# Patient Record
Sex: Male | Born: 1960 | Race: White | Hispanic: No | Marital: Married | Smoking: Never smoker
Health system: Southern US, Community
[De-identification: ages and names within clinical notes are randomized; demographics above are authoritative.]

## PROBLEM LIST (undated history)

## (undated) DIAGNOSIS — K219 Gastro-esophageal reflux disease without esophagitis: Secondary | ICD-10-CM

## (undated) DIAGNOSIS — T7840XA Allergy, unspecified, initial encounter: Secondary | ICD-10-CM

## (undated) DIAGNOSIS — R112 Nausea with vomiting, unspecified: Secondary | ICD-10-CM

## (undated) DIAGNOSIS — I739 Peripheral vascular disease, unspecified: Secondary | ICD-10-CM

## (undated) DIAGNOSIS — T4145XA Adverse effect of unspecified anesthetic, initial encounter: Secondary | ICD-10-CM

## (undated) DIAGNOSIS — M1990 Unspecified osteoarthritis, unspecified site: Secondary | ICD-10-CM

## (undated) DIAGNOSIS — K635 Polyp of colon: Secondary | ICD-10-CM

## (undated) DIAGNOSIS — K2270 Barrett's esophagus without dysplasia: Secondary | ICD-10-CM

## (undated) DIAGNOSIS — Z86718 Personal history of other venous thrombosis and embolism: Secondary | ICD-10-CM

## (undated) DIAGNOSIS — N189 Chronic kidney disease, unspecified: Secondary | ICD-10-CM

## (undated) DIAGNOSIS — Z9889 Other specified postprocedural states: Secondary | ICD-10-CM

## (undated) DIAGNOSIS — R945 Abnormal results of liver function studies: Secondary | ICD-10-CM

## (undated) DIAGNOSIS — T8859XA Other complications of anesthesia, initial encounter: Secondary | ICD-10-CM

## (undated) DIAGNOSIS — R7989 Other specified abnormal findings of blood chemistry: Secondary | ICD-10-CM

## (undated) HISTORY — PX: LASIK: SHX215

## (undated) HISTORY — DX: Abnormal results of liver function studies: R94.5

## (undated) HISTORY — PX: SHOULDER ARTHROSCOPY W/ ACROMIAL REPAIR: SUR94

## (undated) HISTORY — DX: Barrett's esophagus without dysplasia: K22.70

## (undated) HISTORY — DX: Allergy, unspecified, initial encounter: T78.40XA

## (undated) HISTORY — DX: Polyp of colon: K63.5

## (undated) HISTORY — PX: ORIF NASAL FRACTURE: SHX5359

## (undated) HISTORY — DX: Other specified abnormal findings of blood chemistry: R79.89

---

## 2008-07-05 ENCOUNTER — Ambulatory Visit: Payer: Self-pay

## 2008-08-06 ENCOUNTER — Encounter: Admission: RE | Admit: 2008-08-06 | Discharge: 2008-11-04 | Payer: Self-pay | Admitting: Orthopedic Surgery

## 2008-10-09 DIAGNOSIS — I10 Essential (primary) hypertension: Secondary | ICD-10-CM | POA: Insufficient documentation

## 2011-07-28 HISTORY — PX: LUMBAR FUSION: SHX111

## 2011-08-13 ENCOUNTER — Other Ambulatory Visit: Payer: Self-pay | Admitting: Neurological Surgery

## 2011-08-20 ENCOUNTER — Encounter (HOSPITAL_COMMUNITY): Payer: Self-pay | Admitting: Respiratory Therapy

## 2011-08-31 ENCOUNTER — Encounter (HOSPITAL_COMMUNITY)
Admission: RE | Admit: 2011-08-31 | Discharge: 2011-08-31 | Disposition: A | Discharge status: Home / Self Care | Payer: 59 | Source: Ambulatory Visit | Attending: Neurological Surgery | Admitting: Neurological Surgery

## 2011-08-31 ENCOUNTER — Encounter (HOSPITAL_COMMUNITY): Payer: Self-pay

## 2011-08-31 HISTORY — DX: Other specified postprocedural states: R11.2

## 2011-08-31 HISTORY — DX: Other complications of anesthesia, initial encounter: T88.59XA

## 2011-08-31 HISTORY — DX: Gastro-esophageal reflux disease without esophagitis: K21.9

## 2011-08-31 HISTORY — DX: Peripheral vascular disease, unspecified: I73.9

## 2011-08-31 HISTORY — DX: Adverse effect of unspecified anesthetic, initial encounter: T41.45XA

## 2011-08-31 HISTORY — DX: Unspecified osteoarthritis, unspecified site: M19.90

## 2011-08-31 HISTORY — DX: Other specified postprocedural states: Z98.890

## 2011-08-31 HISTORY — DX: Chronic kidney disease, unspecified: N18.9

## 2011-08-31 LAB — SURGICAL PCR SCREEN
MRSA, PCR: NEGATIVE
Staphylococcus aureus: POSITIVE — AB

## 2011-08-31 LAB — CBC
Hemoglobin: 16.1 g/dL (ref 13.0–17.0)
MCH: 31.7 pg (ref 26.0–34.0)
MCHC: 35.9 g/dL (ref 30.0–36.0)
MCV: 88.4 fL (ref 78.0–100.0)

## 2011-08-31 LAB — BASIC METABOLIC PANEL
BUN: 15 mg/dL (ref 6–23)
CO2: 27 mEq/L (ref 19–32)
GFR calc non Af Amer: >90 mL/min (ref >90)
Glucose, Bld: 80 mg/dL (ref 70–99)
Potassium: 4.3 mEq/L (ref 3.5–5.1)

## 2011-08-31 LAB — ABO/RH: ABO/RH(D): O POS

## 2011-08-31 LAB — TYPE AND SCREEN: ABO/RH(D): O POS

## 2011-08-31 NOTE — Pre-Procedure Instructions (Signed)
[address]  08/31/2011   Your procedure is scheduled on:  09/04/2011  Report to [facility] at 5:30 AM.  Call this number if you have problems the morning of surgery: [phone]   Remember:   Do not eat food:After Midnight.  May have clear liquids: up to 4 Hours before arrival.  Clear liquids include soda, tea, black coffee, apple or grape juice, broth.  Take these medicines the morning of surgery with A SIP OF WATER:     PRILOSEC   Do not wear jewelry, make-up or nail polish.  Do not wear lotions, powders, or perfumes. You may wear deodorant.  Do not shave 48 hours prior to surgery.  Do not bring valuables to the hospital.  Contacts, dentures or bridgework may not be worn into surgery.  Leave suitcase in the car. After surgery it may be brought to your room.  For patients admitted to the hospital, checkout time is 11:00 AM the day of discharge.   Patients discharged the day of surgery will not be allowed to drive home.  Name and phone number of your driver:   With WIFE  Special Instructions: CHG Shower Use Special Wash: 1/2 bottle night before surgery and 1/2 bottle morning of surgery.   Please read over the following fact sheets that you were given: Pain Booklet, Coughing and Deep Breathing, Blood Transfusion Information, MRSA Information and Surgical Site Infection Prevention

## 2011-09-03 MED ORDER — CEFAZOLIN SODIUM-DEXTROSE 2-3 GM-% IV SOLR
2.0000 g | Freq: Once | INTRAVENOUS | Status: AC
  Administered 2011-09-04: 2 g via INTRAVENOUS
  Filled 2011-09-03: qty 50

## 2011-09-04 ENCOUNTER — Inpatient Hospital Stay (HOSPITAL_COMMUNITY)
Admission: RE | Admit: 2011-09-04 | Discharge: 2011-09-08 | DRG: 460 | Disposition: A | Discharge status: Home / Self Care | Payer: 59 | Source: Ambulatory Visit | Attending: Neurological Surgery | Admitting: Neurological Surgery

## 2011-09-04 ENCOUNTER — Encounter (HOSPITAL_COMMUNITY)
Admission: RE | Disposition: A | Discharge status: Home / Self Care | Payer: Self-pay | Source: Ambulatory Visit | Attending: Neurological Surgery

## 2011-09-04 ENCOUNTER — Ambulatory Visit (HOSPITAL_COMMUNITY): Payer: 59 | Admitting: Anesthesiology

## 2011-09-04 ENCOUNTER — Ambulatory Visit (HOSPITAL_COMMUNITY): Payer: 59

## 2011-09-04 ENCOUNTER — Inpatient Hospital Stay (HOSPITAL_COMMUNITY): Payer: 59

## 2011-09-04 ENCOUNTER — Encounter (HOSPITAL_COMMUNITY): Payer: Self-pay | Admitting: *Deleted

## 2011-09-04 ENCOUNTER — Encounter (HOSPITAL_COMMUNITY): Payer: Self-pay | Admitting: Anesthesiology

## 2011-09-04 DIAGNOSIS — M109 Gout, unspecified: Secondary | ICD-10-CM | POA: Diagnosis present

## 2011-09-04 DIAGNOSIS — Z01812 Encounter for preprocedural laboratory examination: Secondary | ICD-10-CM

## 2011-09-04 DIAGNOSIS — Z86718 Personal history of other venous thrombosis and embolism: Secondary | ICD-10-CM

## 2011-09-04 DIAGNOSIS — Q762 Congenital spondylolisthesis: Principal | ICD-10-CM

## 2011-09-04 DIAGNOSIS — Z01818 Encounter for other preprocedural examination: Secondary | ICD-10-CM

## 2011-09-04 HISTORY — DX: Personal history of other venous thrombosis and embolism: Z86.718

## 2011-09-04 LAB — PROTIME-INR: INR: 1.02 (ref 0.00–1.49)

## 2011-09-04 SURGERY — POSTERIOR LUMBAR FUSION 1 LEVEL
Anesthesia: General | Site: Spine Lumbar | Wound class: Clean

## 2011-09-04 MED ORDER — MIDAZOLAM HCL 5 MG/5ML IJ SOLN
INTRAMUSCULAR | Status: DC | PRN
  Administered 2011-09-04: 2 mg via INTRAVENOUS

## 2011-09-04 MED ORDER — LACTATED RINGERS IV SOLN
INTRAVENOUS | Status: DC | PRN
  Administered 2011-09-04 (×2): via INTRAVENOUS

## 2011-09-04 MED ORDER — MENTHOL 3 MG MT LOZG: 1.0000 | LOZENGE | OROMUCOSAL | Status: DC | PRN

## 2011-09-04 MED ORDER — PHENOL 1.4 % MT LIQD: 1.0000 | OROMUCOSAL | Status: DC | PRN

## 2011-09-04 MED ORDER — ALUM & MAG HYDROXIDE-SIMETH 200-200-20 MG/5ML PO SUSP: 30.0000 mL | Freq: Four times a day (QID) | ORAL | Status: DC | PRN

## 2011-09-04 MED ORDER — LIDOCAINE-EPINEPHRINE 1 %-1:100000 IJ SOLN
INTRAMUSCULAR | Status: DC | PRN
  Administered 2011-09-04: 5 mL

## 2011-09-04 MED ORDER — HEMOSTATIC AGENTS (NO CHARGE) OPTIME: TOPICAL | Status: DC | PRN

## 2011-09-04 MED ORDER — GLYCOPYRROLATE 0.2 MG/ML IJ SOLN
INTRAMUSCULAR | Status: DC | PRN
  Administered 2011-09-04: .6 mg via INTRAVENOUS

## 2011-09-04 MED ORDER — DIPHENHYDRAMINE HCL 50 MG/ML IJ SOLN: 12.5000 mg | Freq: Four times a day (QID) | INTRAMUSCULAR | Status: DC | PRN

## 2011-09-04 MED ORDER — BACITRACIN 50000 UNITS IM SOLR
INTRAMUSCULAR | Status: AC
  Filled 2011-09-04: qty 1

## 2011-09-04 MED ORDER — OMEPRAZOLE MAGNESIUM 20 MG PO TBEC: 20.0000 mg | DELAYED_RELEASE_TABLET | ORAL | Status: DC

## 2011-09-04 MED ORDER — ONDANSETRON HCL 4 MG/2ML IJ SOLN
INTRAMUSCULAR | Status: AC
  Filled 2011-09-04: qty 2

## 2011-09-04 MED ORDER — MEPERIDINE HCL 25 MG/ML IJ SOLN: 6.2500 mg | INTRAMUSCULAR | Status: DC | PRN

## 2011-09-04 MED ORDER — MORPHINE SULFATE (PF) 1 MG/ML IV SOLN
INTRAVENOUS | Status: DC
  Administered 2011-09-04: 4.5 mg via INTRAVENOUS
  Administered 2011-09-04: 22:00:00 via INTRAVENOUS
  Administered 2011-09-05: 15 mg via INTRAVENOUS
  Filled 2011-09-04 (×2): qty 25

## 2011-09-04 MED ORDER — ALUM & MAG HYDROXIDE-SIMETH 200-200-20 MG/5ML PO SUSP
30.0000 mL | ORAL | Status: DC | PRN
  Administered 2011-09-04 – 2011-09-05 (×3): 30 mL via ORAL
  Filled 2011-09-04 (×4): qty 30

## 2011-09-04 MED ORDER — ROCURONIUM BROMIDE 100 MG/10ML IV SOLN
INTRAVENOUS | Status: DC | PRN
  Administered 2011-09-04: 10 mg via INTRAVENOUS
  Administered 2011-09-04 (×2): 20 mg via INTRAVENOUS
  Administered 2011-09-04: 50 mg via INTRAVENOUS

## 2011-09-04 MED ORDER — ONDANSETRON HCL 4 MG/2ML IJ SOLN
4.0000 mg | Freq: Four times a day (QID) | INTRAMUSCULAR | Status: DC | PRN
  Administered 2011-09-04: 4 mg via INTRAVENOUS
  Filled 2011-09-04: qty 2

## 2011-09-04 MED ORDER — FENTANYL CITRATE 0.05 MG/ML IJ SOLN
INTRAMUSCULAR | Status: DC | PRN
  Administered 2011-09-04 (×4): 50 ug via INTRAVENOUS
  Administered 2011-09-04: 100 ug via INTRAVENOUS
  Administered 2011-09-04: 50 ug via INTRAVENOUS

## 2011-09-04 MED ORDER — NEOSTIGMINE METHYLSULFATE 1 MG/ML IJ SOLN
INTRAMUSCULAR | Status: DC | PRN
  Administered 2011-09-04: 4 mg via INTRAVENOUS

## 2011-09-04 MED ORDER — SODIUM CHLORIDE 0.9 % IJ SOLN: 9.0000 mL | INTRAMUSCULAR | Status: DC | PRN

## 2011-09-04 MED ORDER — HYDROMORPHONE HCL PF 1 MG/ML IJ SOLN
INTRAMUSCULAR | Status: AC
  Filled 2011-09-04: qty 1

## 2011-09-04 MED ORDER — MORPHINE SULFATE 2 MG/ML IJ SOLN: 5.0000 mg | INTRAMUSCULAR | Status: DC | PRN

## 2011-09-04 MED ORDER — DIAZEPAM 5 MG PO TABS
5.0000 mg | ORAL_TABLET | Freq: Four times a day (QID) | ORAL | Status: DC | PRN
  Administered 2011-09-05 – 2011-09-08 (×5): 5 mg via ORAL
  Filled 2011-09-04 (×6): qty 1

## 2011-09-04 MED ORDER — BUPIVACAINE HCL (PF) 0.5 % IJ SOLN
INTRAMUSCULAR | Status: DC | PRN
  Administered 2011-09-04: 20 mL
  Administered 2011-09-04: 5 mL

## 2011-09-04 MED ORDER — MORPHINE SULFATE (PF) 1 MG/ML IV SOLN
INTRAVENOUS | Status: DC
Start: 2011-09-04 — End: 2011-09-04
  Administered 2011-09-04: 12:00:00 via INTRAVENOUS
  Administered 2011-09-04: 7.5 mg via INTRAVENOUS
  Filled 2011-09-04: qty 25

## 2011-09-04 MED ORDER — 0.9 % SODIUM CHLORIDE (POUR BTL) OPTIME
TOPICAL | Status: DC | PRN
  Administered 2011-09-04: 1000 mL

## 2011-09-04 MED ORDER — OXYCODONE-ACETAMINOPHEN 5-325 MG PO TABS
1.0000 | ORAL_TABLET | ORAL | Status: DC | PRN
  Administered 2011-09-05 – 2011-09-08 (×14): 2 via ORAL
  Filled 2011-09-04 (×14): qty 2

## 2011-09-04 MED ORDER — SODIUM CHLORIDE 0.9 % IJ SOLN: 3.0000 mL | INTRAMUSCULAR | Status: DC | PRN

## 2011-09-04 MED ORDER — DEXAMETHASONE SODIUM PHOSPHATE 4 MG/ML IJ SOLN
INTRAMUSCULAR | Status: DC | PRN
  Administered 2011-09-04: 4 mg via INTRAVENOUS

## 2011-09-04 MED ORDER — DIPHENHYDRAMINE HCL 12.5 MG/5ML PO ELIX: 12.5000 mg | ORAL_SOLUTION | Freq: Four times a day (QID) | ORAL | Status: DC | PRN

## 2011-09-04 MED ORDER — THROMBIN 20000 UNITS EX KIT
PACK | CUTANEOUS | Status: DC | PRN
  Administered 2011-09-04: 09:00:00 via TOPICAL

## 2011-09-04 MED ORDER — SODIUM CHLORIDE 0.9 % IV SOLN: 250.0000 mL | INTRAVENOUS | Status: DC

## 2011-09-04 MED ORDER — NALOXONE HCL 0.4 MG/ML IJ SOLN: 0.4000 mg | INTRAMUSCULAR | Status: DC | PRN

## 2011-09-04 MED ORDER — SODIUM CHLORIDE 0.9 % IJ SOLN
3.0000 mL | Freq: Two times a day (BID) | INTRAMUSCULAR | Status: DC
  Administered 2011-09-04 – 2011-09-07 (×5): 3 mL via INTRAVENOUS

## 2011-09-04 MED ORDER — PROPOFOL 10 MG/ML IV EMUL
INTRAVENOUS | Status: DC | PRN
  Administered 2011-09-04: 300 mg via INTRAVENOUS

## 2011-09-04 MED ORDER — ACETAMINOPHEN 325 MG PO TABS: 650.0000 mg | ORAL_TABLET | ORAL | Status: DC | PRN

## 2011-09-04 MED ORDER — ONDANSETRON HCL 4 MG/2ML IJ SOLN
4.0000 mg | INTRAMUSCULAR | Status: DC | PRN
  Administered 2011-09-04 (×2): 4 mg via INTRAVENOUS
  Filled 2011-09-04: qty 2

## 2011-09-04 MED ORDER — MORPHINE SULFATE 4 MG/ML IJ SOLN
1.0000 mg | INTRAMUSCULAR | Status: DC | PRN
  Administered 2011-09-05: 1 mg via INTRAVENOUS
  Administered 2011-09-07 – 2011-09-08 (×2): 2 mg via INTRAVENOUS
  Filled 2011-09-04 (×4): qty 1

## 2011-09-04 MED ORDER — SODIUM CHLORIDE 0.9 % IR SOLN
Status: DC | PRN
  Administered 2011-09-04: 09:00:00

## 2011-09-04 MED ORDER — OMEPRAZOLE 20 MG PO CPDR
20.0000 mg | DELAYED_RELEASE_CAPSULE | Freq: Every day | ORAL | Status: DC
  Administered 2011-09-04 – 2011-09-07 (×4): 20 mg via ORAL
  Filled 2011-09-04 (×7): qty 1

## 2011-09-04 MED ORDER — PROMETHAZINE HCL 25 MG/ML IJ SOLN: 6.2500 mg | INTRAMUSCULAR | Status: DC | PRN

## 2011-09-04 MED ORDER — HETASTARCH-ELECTROLYTES 6 % IV SOLN
INTRAVENOUS | Status: DC | PRN
  Administered 2011-09-04: 09:00:00 via INTRAVENOUS

## 2011-09-04 MED ORDER — SODIUM CHLORIDE 0.9 % IV SOLN
INTRAVENOUS | Status: DC
  Administered 2011-09-04: 22:00:00 via INTRAVENOUS

## 2011-09-04 MED ORDER — ACETAMINOPHEN 650 MG RE SUPP: 650.0000 mg | RECTAL | Status: DC | PRN

## 2011-09-04 MED ORDER — SODIUM CHLORIDE 0.9 % IV SOLN
INTRAVENOUS | Status: AC
  Filled 2011-09-04: qty 500

## 2011-09-04 MED ORDER — ONDANSETRON HCL 4 MG/2ML IJ SOLN
INTRAMUSCULAR | Status: DC | PRN
  Administered 2011-09-04: 4 mg via INTRAVENOUS

## 2011-09-04 MED ORDER — WARFARIN SODIUM 5 MG PO TABS
5.0000 mg | ORAL_TABLET | Freq: Every day | ORAL | Status: DC
Start: 2011-09-04 — End: 2011-09-08
  Administered 2011-09-04 – 2011-09-07 (×4): 5 mg via ORAL
  Filled 2011-09-04 (×5): qty 1

## 2011-09-04 MED ORDER — LACTATED RINGERS IV SOLN: INTRAVENOUS | Status: DC

## 2011-09-04 MED ORDER — HYDROMORPHONE HCL PF 1 MG/ML IJ SOLN
0.2500 mg | INTRAMUSCULAR | Status: DC | PRN
  Administered 2011-09-04: 0.5 mg via INTRAVENOUS

## 2011-09-04 SURGICAL SUPPLY — 63 items
BAG DECANTER FOR FLEXI CONT (MISCELLANEOUS) ×2 IMPLANT
BLADE SURG ROTATE 9660 (MISCELLANEOUS) ×2 IMPLANT
BUR MATCHSTICK NEURO 3.0 LAGG (BURR) ×2 IMPLANT
CAGE 13MM (Cage) ×4 IMPLANT
CANISTER SUCTION 2500CC (MISCELLANEOUS) ×2 IMPLANT
CLOTH BEACON ORANGE TIMEOUT ST (SAFETY) ×2 IMPLANT
CONT SPEC 4OZ CLIKSEAL STRL BL (MISCELLANEOUS) ×4 IMPLANT
COVER BACK TABLE 24X17X13 BIG (DRAPES) ×2 IMPLANT
COVER TABLE BACK 60X90 (DRAPES) IMPLANT
DECANTER SPIKE VIAL GLASS SM (MISCELLANEOUS) ×2 IMPLANT
DERMABOND ADHESIVE PROPEN (GAUZE/BANDAGES/DRESSINGS) ×1
DERMABOND ADVANCED (GAUZE/BANDAGES/DRESSINGS)
DERMABOND ADVANCED .7 DNX12 (GAUZE/BANDAGES/DRESSINGS) IMPLANT
DERMABOND ADVANCED .7 DNX6 (GAUZE/BANDAGES/DRESSINGS) ×1 IMPLANT
DRAPE C-ARM 42X72 X-RAY (DRAPES) ×4 IMPLANT
DRAPE LAPAROTOMY 100X72X124 (DRAPES) ×2 IMPLANT
DRAPE POUCH INSTRU U-SHP 10X18 (DRAPES) ×2 IMPLANT
DRAPE PROXIMA HALF (DRAPES) IMPLANT
DURAPREP 26ML APPLICATOR (WOUND CARE) ×2 IMPLANT
ELECT REM PT RETURN 9FT ADLT (ELECTROSURGICAL) ×2
ELECTRODE REM PT RTRN 9FT ADLT (ELECTROSURGICAL) ×1 IMPLANT
GAUZE SPONGE 4X4 16PLY XRAY LF (GAUZE/BANDAGES/DRESSINGS) ×2 IMPLANT
GLOVE BIO SURGEON STRL SZ8 (GLOVE) ×2 IMPLANT
GLOVE BIOGEL PI IND STRL 7.0 (GLOVE) ×1 IMPLANT
GLOVE BIOGEL PI IND STRL 8.5 (GLOVE) ×6 IMPLANT
GLOVE BIOGEL PI INDICATOR 7.0 (GLOVE) ×1
GLOVE BIOGEL PI INDICATOR 8.5 (GLOVE) ×6
GLOVE ECLIPSE 8.5 STRL (GLOVE) ×4 IMPLANT
GLOVE EXAM NITRILE LRG STRL (GLOVE) IMPLANT
GLOVE EXAM NITRILE MD LF STRL (GLOVE) ×2 IMPLANT
GLOVE EXAM NITRILE XL STR (GLOVE) IMPLANT
GLOVE EXAM NITRILE XS STR PU (GLOVE) IMPLANT
GLOVE SURG SS PI 6.5 STRL IVOR (GLOVE) ×4 IMPLANT
GLOVE SURG SS PI 8.0 STRL IVOR (GLOVE) ×8 IMPLANT
GOWN BRE IMP SLV AUR LG STRL (GOWN DISPOSABLE) IMPLANT
GOWN BRE IMP SLV AUR XL STRL (GOWN DISPOSABLE) ×4 IMPLANT
GOWN STRL REIN 2XL LVL4 (GOWN DISPOSABLE) ×12 IMPLANT
KIT BASIN OR (CUSTOM PROCEDURE TRAY) ×2 IMPLANT
KIT ROOM TURNOVER OR (KITS) ×2 IMPLANT
NEEDLE HYPO 22GX1.5 SAFETY (NEEDLE) ×2 IMPLANT
NS IRRIG 1000ML POUR BTL (IV SOLUTION) ×2 IMPLANT
PACK FOAM VITOSS 10CC (Orthopedic Implant) ×2 IMPLANT
PACK LAMINECTOMY NEURO (CUSTOM PROCEDURE TRAY) ×2 IMPLANT
PAD ARMBOARD 7.5X6 YLW CONV (MISCELLANEOUS) ×10 IMPLANT
PATTIES SURGICAL .5 X1 (DISPOSABLE) IMPLANT
PUREGEN 2.0ML LRG (Bone Implant) ×2 IMPLANT
ROD 35MM (Rod) ×4 IMPLANT
SCREW 35MM PEDICLE (Screw) ×8 IMPLANT
SCREW SET SPINAL STD HEXALOBE (Screw) ×8 IMPLANT
SPONGE GAUZE 4X4 12PLY (GAUZE/BANDAGES/DRESSINGS) IMPLANT
SPONGE LAP 4X18 X RAY DECT (DISPOSABLE) IMPLANT
SPONGE SURGIFOAM ABS GEL 100 (HEMOSTASIS) ×2 IMPLANT
SUT VIC AB 1 CT1 18XBRD ANBCTR (SUTURE) ×1 IMPLANT
SUT VIC AB 1 CT1 8-18 (SUTURE) ×1
SUT VIC AB 2-0 CP2 18 (SUTURE) ×2 IMPLANT
SUT VIC AB 3-0 SH 8-18 (SUTURE) ×4 IMPLANT
SYR 20ML ECCENTRIC (SYRINGE) ×2 IMPLANT
TAPE CLOTH SURG 4X10 WHT LF (GAUZE/BANDAGES/DRESSINGS) ×2 IMPLANT
TOWEL OR 17X24 6PK STRL BLUE (TOWEL DISPOSABLE) ×2 IMPLANT
TOWEL OR 17X26 10 PK STRL BLUE (TOWEL DISPOSABLE) ×2 IMPLANT
TRAP SPECIMEN MUCOUS 40CC (MISCELLANEOUS) ×2 IMPLANT
TRAY FOLEY CATH 14FRSI W/METER (CATHETERS) ×2 IMPLANT
WATER STERILE IRR 1000ML POUR (IV SOLUTION) ×2 IMPLANT

## 2011-09-04 NOTE — Transfer of Care (Signed)
Immediate Anesthesia Transfer of Care Note  Patient: Wesley Mendez  Procedure(s) Performed:  POSTERIOR LUMBAR FUSION 1 LEVEL - Lumbar five-Sacral one Decompression/Posterior lumbar interbody fusion/Pedicle screws/Cellsaver  Patient Location: PACU  Anesthesia Type: General  Level of Consciousness: awake, alert , oriented and sedated  Airway & Oxygen Therapy: Patient Spontanous Breathing and Patient connected to nasal cannula oxygen  Post-op Assessment: Report given to PACU RN  Post vital signs: Reviewed  Complications: No apparent anesthesia complications

## 2011-09-04 NOTE — Progress Notes (Signed)
Subjective: Patient reports Alert some nausea left leg feels better. Reminded me of history of upper extremity DVT after shoulder surgery.  Objective: Vital signs in last 24 hours: Temp:  [97.3 F (36.3 C)-98.7 F (37.1 C)] 98.7 F (37.1 C) (02/08 1754) Pulse Rate:  [56-85] 79  (02/08 1754) Resp:  [14-24] 16  (02/08 1754) BP: (108-139)/(72-90) 108/72 mmHg (02/08 1754) SpO2:  [92 %-98 %] 96 % (02/08 1754)  Intake/Output from previous day:   Intake/Output this shift:    Dressing clean and dry. Motor function good in lower extremity  Lab Results: No results found for this basename: WBC:2,HGB:2,HCT:2,PLT:2 in the last 72 hours BMET No results found for this basename: NA:2,K:2,CL:2,CO2:2,GLUCOSE:2,BUN:2,CREATININE:2,CALCIUM:2 in the last 72 hours  Studies/Results: Dg Lumbar Spine 2-3 Views  09/04/2011  *RADIOLOGY REPORT*  Clinical Data: Lumbar fusion.  LUMBAR SPINE - 2-3 VIEW  Comparison: Intraoperative film 09/04/2011.  Findings: Intraoperative fluoro spot images demonstrates L5-S1 PLIF.  Bilateral pedicle screws are in place.  Alignment is stable.  IMPRESSION: Status post L5-S1 PLIF without radiographic evidence for complication.  Original Report Authenticated By: Jamesetta Orleans. MATTERN, M.D.   Dg Lumbar Spine 1 View  09/04/2011  *RADIOLOGY REPORT*  Clinical Data: Back pain  LUMBAR SPINE - 1 VIEW  Comparison: MRI 05/11/2011.  Findings: Intraoperative cross-table lateral film demonstrates an angled probe directed most closely toward the L5-S1 disc space.  IMPRESSION: As above.  Original Report Authenticated By: Elsie Stain, M.D.    Assessment/Plan: Will start Coumadin anticoagulation casually DVT prophylaxis. Start 5 mg component per day.  LOS: 0 days  Mobilize out of bed. Her Coumadin anticoagulation. DC PCA in a.m.   Orey Moure J 09/04/2011, 7:29 PM

## 2011-09-04 NOTE — Progress Notes (Signed)
Utilization review completed. Sharda Keddy, RN, BSN. 09/04/11  

## 2011-09-04 NOTE — H&P (Signed)
September 04, 2011 No changes in H&P from previous notes.  [address]  Doyne Micke  #[id number] DOB:  25-Sep-1960 08/12/2011:     Mr. Zorn returns to the office today to discuss concerns with his spondylolisthesis.  Since our last visit, it seems that Berthold has developed an increasingly severe foot drop in that left lower extremity.  He notes that he cannot walk on his heels at all without the foot giving way.  He finds that the pain is increasing particularly in the area of the shin and also in the area of the hip on that left side likely due to some gluteus muscle involvement.    I reviewed his MRI from October of 2012.  It demonstrated that he had a grade 1 spondylolisthesis with significant foraminal stenosis for both L5 nerve roots.  Interestingly, his right leg does not seem to bother him much at all.  There is a combination of mostly leg pain but significant back pain that also seems to be distressing to him.    I reviewed and discussed the surgical intervention that we would consider for this process.  It would involve a Gill procedure where we would remove the spinal arch of L5, decompress the L5 nerve roots on both sides, place spacers in the interspace at L5-S1 and pedicle screws at L5 and S1 to fuse those two segments together.  We would place bone graft in the disc space and bone grafts also in the lateral gutters.  I noted that the surgery necessitates that he heal bone to bone and this is indeed a substantial process.    The best part of Kamil's situation is that he has only single level disease.  I believe that he should do well with surgical decompression and stabilization.  He does need to give himself an adequate period of time to heal.  That would require upwards of 4 months and possibly 6 months time.  He will be in a brace for a period of 6-8 weeks after the surgery.    At this point, I do not believe that conservative management has a further roll.  He also has a significant foot  drop and one of the goals for surgery is to minimize the impact of the foot drop and perhaps even reverse some of the changes there.  We will proceed with the surgery at the earliest convenience.  This will likely be in the beginning of February.    ADDENDUM:  It was also noted that [facility] had upper extremity deep venous thrombosis when he had some shoulder reconstruction surgery by Dr. Thurston Hole. I indicated that likely after the lumbar surgery we would want to start him on a blood thinner also and would maintain him on this for a minimum of 3 months'  time as I believe his risks of having a deep venous thrombosis and possibly pulmonary embolus is significantly increased given that history.           Stefani Dama, M.D./[facility]de [facility]  Izear Pine  #[id number]  DOB:  Aug 12, 1960     September 05, 2010  CHIEF COMPLAINT:   Numbness and tingling in left leg upon standing, back pain.    HISTORY OF PRESENT ILLNESS:  Maxwel Meadowcroft is a 51 year old, right-handed individual who tells me that he has had problems with his back for a number of years and back in 2007, he underwent an MRI of the lumbar spine.  He was treated by Dr. Alois Cliche for  some left hip and leg symptoms and at that time he had a hip injection which did not help him at all and he was concerned because he was mostly having back problems.  He is seen now for increasing difficulty with his left lower extremity.  He finds that if he stands for just a few minutes, he will get numbness throughout the entirety of his left leg.  His back pain has been persistent also, maybe getting worse in the more recent past such that his tolerance for activity is decreasing.  He continues to work full-time at [employer] and he had had some shoulder surgery done by Dr. Hadassah Pais and he is referred via the courtesy of Dr. Thurston Hole for further evaluation of the problems with his back.    Today in the office, we have an MRI from 2007 which I have had the  opportunity to review.  It demonstrates that there is a Grade I spondylolisthesis at L5-S1 and there is some modest foraminal stenosis for the left-sided L5 nerve root.  On further review, the rest of the MRI shows normally healthy vertebrae throughout the spine.  He does have a little fatty lesion within the filum terminale.    PAST MEDICAL HISTORY:  His general health has been good.  He did have a blood clot in the upper arm after some surgery.    PAST SURGICAL HISTORY:  Previous surgeries only include a shoulder surgery on the right side done in December of 2009.    DRUG ALLERGIES:    He notes no allergies to any medications.    PERSONAL HISTORY:   He does not smoke nor does he use any alcohol.  Height and weight have been stable at 226 lbs. and 6', 2".    FAMILY HISTORY:    Notable for a mother with COPD and father died of heart failure.  There is a history of some rheumatoid arthritis, gout and kidney stones.   REVIEW OF SYSTEMS:   Systems review is notable for indigestion and pain with eating, sinus problems, sinus headaches, leg weakness, back pain, joint pain, swelling in the hands and knees on a 14-point review sheet noted in the office today.    CURRENT MEDICATIONS:  His current medications include some herbal supplements for gout, Sudafed and Advil on a prn basis.    PHYSICAL EXAMINATION:  He stands straight and erect.  He demonstrates good strength in the tibialis anterior and the gastrocs group and his gait is nonantalgic.    IMPRESSION:    At this point, it appears that the patient has a spondylolisthesis.  Today in the office, we obtained some new radiographs including a lateral flexion and extension and an AP of the pelvis.  The pelvis demonstrates that the hip joints are within the limits of normal.  The SI joints are also normal.  Lower lumbar spine can see some arthropathy within the facet joints.  He now has a Grade II spondylolisthesis which appears to show stability with  flexion and extension.  The other discs appear to be stable and the alignment of his back in the sagittal plane is normal.  I indicated to the patient that the best first treatment may be to try a transforaminal epidural steroid injection at L5-S1 on the left side to help ameliorate some of the irritability of that left-sided nerve root and, hopefully, make it more refractory to the symptoms of numbness and pain that he is experiencing.  If this is the  case, this may be a good way to treat this process.  He tells me that he has been doing a program of some back exercises, but the exercises seem to be refractory in that they do not alleviate the symptoms.  Perhaps, with the epidural steroid injection, it will bring the situation under control and we can continue to treat him conservatively.  If not, however, I did discuss briefly the prospects for surgical decompression of the lumbar spine, along with a fusion at L5-S1.  Certainly, with his motor status being intact, there is no imminent risk for this or necessity of it.  I believe that a trial of a transforaminal injection would be his best option at the current time.  He will continue to do his stretching and strengthening exercises for his lumbar spine.    [facility]    Stefani Dama, M.D.

## 2011-09-04 NOTE — Anesthesia Postprocedure Evaluation (Signed)
  Anesthesia Post-op Note  Patient: Wesley Mendez  Procedure(s) Performed:  POSTERIOR LUMBAR FUSION 1 LEVEL - Lumbar five-Sacral one Decompression/Posterior lumbar interbody fusion/Pedicle screws/Cellsaver  Patient Location: PACU  Anesthesia Type: General  Level of Consciousness: awake  Airway and Oxygen Therapy: Patient Spontanous Breathing  Post-op Pain: mild  Post-op Assessment: Post-op Vital signs reviewed  Post-op Vital Signs: stable  Complications: No apparent anesthesia complications

## 2011-09-04 NOTE — Anesthesia Preprocedure Evaluation (Signed)
Anesthesia Evaluation  Patient identified by MRN, date of birth, ID band Patient awake    Reviewed: Allergy & Precautions, H&P , NPO status , Patient's Chart, lab work & pertinent test results, reviewed documented beta blocker date and time   History of Anesthesia Complications (+) DIFFICULT AIRWAY  Airway Mallampati: II      Dental   Pulmonary neg pulmonary ROS,  clear to auscultation        Cardiovascular hypertension, Regular Normal    Neuro/Psych Negative Neurological ROS  Negative Psych ROS   GI/Hepatic Neg liver ROS, GERD-  ,  Endo/Other  Negative Endocrine ROS  Renal/GU negative Renal ROS     Musculoskeletal negative musculoskeletal ROS (+)   Abdominal   Peds  Hematology   Anesthesia Other Findings   Reproductive/Obstetrics                           Anesthesia Physical Anesthesia Plan  ASA: II  Anesthesia Plan: General   Post-op Pain Management:    Induction: Intravenous  Airway Management Planned: Oral ETT  Additional Equipment:   Intra-op Plan:   Post-operative Plan:   Informed Consent: I have reviewed the patients History and Physical, chart, labs and discussed the procedure including the risks, benefits and alternatives for the proposed anesthesia with the patient or authorized representative who has indicated his/her understanding and acceptance.     Plan Discussed with: CRNA  Anesthesia Plan Comments:         Anesthesia Quick Evaluation

## 2011-09-04 NOTE — Preoperative (Signed)
Beta Blockers   Reason not to administer Beta Blockers:Not Applicable 

## 2011-09-04 NOTE — Op Note (Signed)
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 Surgeon: Barnett Abu M.D.  Asst.: Maeola Harman M.D. Anesthesia: Gen. endotracheal Indications: Patient is Wesley Mendez is a 51 y.o. male who who's had significant back pain and lumbar radiculopathy for over a years period time. A lumbar myelogram demonstrates advanced spondylolisthesis with high-grade canal stenosis. he was advised regarding surgical intervention.  Procedure: The patient was brought to the operating room supine on a stretcher. After the smooth induction of general endotracheal anesthesia she was turned prone and the back was prepped with alcohol and DuraPrep. The back was then draped sterilely. A midline incision was created and carried down to the lumbar dorsal fascia. A localizing radiograph identified the  L5 spinous processes. A subligamentous dissection was created at L5 to expose the interlaminar space at  L5 S1 and the facet joints over the L5-S1 interspace. Laminotomies were were then created removing the entire inferior margin of the lamina of L5 including the inferior facet at the L5-S1 joint. The pars was identified and noted to be loose in reference to the vertebral body of L5 the entire laminar arch was then removed. The yellow ligament was taken up and the common dural tube was exposed along with the L5 nerve root superiorly, and the S1 nerve root inferiorly, the disc space was exposed and epidural veins in this region were cauterized and divided. The L5 nerve roots and the S1 nerve root were dissected with care taken to protect them. The disc space was opened and  a combination of curettes and rongeurs was used to evacuate the disc space fully. The endplates were removed using sharp curettes. An interbody spacer was placed to distract the disc space while the contralateral discectomy was performed. When the entirety of the disc was removed and the endplates were prepared final sizing of the disc space was obtained 13 mm peek spacers were chosen and packed with autograft and allograft and placed into the interspace. The remainder of the interspace was packed with autograft and allograft. He was noted during this dissection that the right-sided L5 nerve root was particularly stenotic with a subligamentous disc protrusion in the foraminal and extraforaminal space. This was all dissected free. Pedicle entry sites were then chosen using fluoroscopic guidance and 5.5 x 3 5 mm screws were placed in L5 and 6.5 x 35 mm screws were placed in S1. The lateral gutters were decorticated and graft was packed in the posterolateral gutters between L5-S1. Final radiographs were obtained after placing appropriately sized rods between the pedicle screws at L5-S1 and torquing these to the appropriate tension. Rods were 35 mm in length. The surgical site was inspected carefully to assure the  L5 nerve roots were well decompressed, hemostasis was obtained, and the graft was well packed. Then the retractors were removed and the wound was closed with #1 Vicryl in the lumbar dorsal fascia 2-0 Vicryl in the subcutaneous tissue and 3-0 Vicryl subcuticularly. When he cc of half percent Marcaine was injected into the paraspinous musculature at the time of closure. Blood loss was estimated at 250 cc. The patient tolerated procedure well and was returned to the recovery room in stable condition.

## 2011-09-05 LAB — PROTIME-INR: Prothrombin Time: 13.9 seconds (ref 11.6–15.2)

## 2011-09-05 NOTE — Progress Notes (Signed)
Physical Therapy Evaluation Patient Details Name: Wesley Mendez MRN: [id number] DOB: 23-Dec-1960 Today's Date: 09/05/2011  Problem List:  Patient Active Problem List  Diagnoses  . HYPERTENSION    Past Medical History:  Past Medical History  Diagnosis Date  . Complication of anesthesia     N&V- post nasal surgery-1980  . PONV (postoperative nausea and vomiting)     no N&V post shoulder surgery- 2010  . Peripheral vascular disease     post shoulder surgery- bld. clot in arm, treated with Coumadin   . GERD (gastroesophageal reflux disease)   . Chronic kidney disease     as a young adult, spontaneous passing   . Arthritis     lumbar congenital spondylolisthesis , radiculopathy, multiple areas of arthritis   . Gout   . H/O blood clots     shoulder   Past Surgical History:  Past Surgical History  Procedure Date  . Orif nasal fracture     1980- fr. playing soccer  . Shoulder arthroscopy w/ acromial repair     dislocated shoulder,everything repaired except RC,    . Eye surgery     lasik done- 2004  . Lumbar laminectomy     PT Assessment/Plan/Recommendation PT Assessment Clinical Impression Statement: Pt presents with a medical diagnosis of L5-S1 fusion along with the following impairments/deficits and therapy diagnosis listed below. Pt will benefit from skilled PT in the acute care setting in order to maximize functional mobility for a safe d/c home with wife PT Recommendation/Assessment: Patient will need skilled PT in the acute care venue PT Problem List: Decreased strength;Decreased activity tolerance;Decreased mobility;Decreased knowledge of use of DME;Decreased knowledge of precautions;Pain PT Therapy Diagnosis : Difficulty walking;Acute pain PT Plan PT Frequency: Min 5X/week PT Treatment/Interventions: DME instruction;Gait training;Stair training;Functional mobility training;Therapeutic activities;Balance training;Patient/family education PT Recommendation Follow Up  Recommendations: No PT follow up;Supervision - Intermittent Equipment Recommended: None recommended by PT PT Goals  Acute Rehab PT Goals PT Goal Formulation: With patient/family Time For Goal Achievement: 7 days Pt will go Supine/Side to Sit: with modified independence PT Goal: Supine/Side to Sit - Progress: Goal set today Pt will go Sit to Supine/Side: with modified independence PT Goal: Sit to Supine/Side - Progress: Goal set today Pt will go Sit to Stand: with modified independence PT Goal: Sit to Stand - Progress: Goal set today Pt will go Stand to Sit: with modified independence PT Goal: Stand to Sit - Progress: Goal set today Pt will Ambulate: >150 feet;with supervision;with least restrictive assistive device PT Goal: Ambulate - Progress: Goal set today Pt will Go Up / Down Stairs: Flight;with min assist;with rail(s) PT Goal: Up/Down Stairs - Progress: Goal set today  PT Evaluation Precautions/Restrictions  Precautions Precautions: Back Precaution Comments: pt educated on 3/3 back precautions Required Braces or Orthoses: Yes Spinal Brace: Lumbar corset Restrictions Weight Bearing Restrictions: No Prior Functioning  Home Living Lives With: Spouse Receives Help From: Family Type of Home: House Home Layout: Two level;Able to live on main level with bedroom/bathroom Alternate Level Stairs-Rails: Can reach both Alternate Level Stairs-Number of Steps: 12 Home Access: Stairs to enter Entrance Stairs-Rails: Left Entrance Stairs-Number of Steps: 5 Bathroom Shower/Tub: Tub/shower unit;Curtain Bathroom Toilet: Standard Bathroom Accessibility: Yes How Accessible: Accessible via walker Home Adaptive Equipment: None;Other (comment) (has assess to equipment if needed) Prior Function Level of Independence: Independent with homemaking with ambulation;Independent with basic ADLs;Independent with transfers;Independent with gait Able to Take Stairs?: Yes Driving: Yes Vocation: Full  time employment Leisure: Hobbies-yes (Comment)  Comments: swimming and fishing Cognition Cognition Arousal/Alertness: Awake/alert Overall Cognitive Status: Appears within functional limits for tasks assessed Orientation Level: Oriented X4 Sensation/Coordination Sensation Light Touch: Appears Intact Extremity Assessment RLE Assessment RLE Assessment: Within Functional Limits LLE Assessment LLE Assessment: Within Functional Limits (left slightly weaker than R, still WFL) Mobility (including Balance) Bed Mobility Bed Mobility: Yes Rolling Right: 4: Min assist;With rail Rolling Right Details (indicate cue type and reason): VC for proper sequencing to maintain back precautions. Min assist through shoulders Right Sidelying to Sit: 4: Min assist;With rails;HOB elevated (comment degrees) (30) Right Sidelying to Sit Details (indicate cue type and reason): VC for sequencing in order to maintain back precautions. Tactile cues and assist through pelvis Sitting - Scoot to Edge of Bed: 6: Modified independent (Device/Increase time) Transfers Transfers: Yes Sit to Stand: 3: Mod assist;With upper extremity assist;From bed Sit to Stand Details (indicate cue type and reason): VC for hand placement and safety with RW. Pt required assist for forward translation while maintaining back precautions Stand to Sit: 4: Min assist;With upper extremity assist;To chair/3-in-1 Stand to Sit Details: VC for hand placement. Pt able to control descent while maintaining precautions Ambulation/Gait Ambulation/Gait: Yes Ambulation/Gait Assistance: 4: Min assist Ambulation/Gait Assistance Details (indicate cue type and reason): Min assist for stability. VC for safety with RW and proper sequencing Ambulation Distance (Feet): 50 Feet Assistive device: Rolling walker Gait Pattern: Step-to pattern;Decreased hip/knee flexion - right;Decreased hip/knee flexion - left;Decreased stride length;Antalgic Gait velocity: Decreased  gait speed Stairs: No    Exercise    End of Session PT - End of Session Equipment Utilized During Treatment: Gait belt Activity Tolerance: Patient tolerated treatment well Patient left: in chair;with call bell in reach;with family/visitor present Nurse Communication: Mobility status for transfers;Mobility status for ambulation General Behavior During Session: [facility] for tasks performed Cognition: [facility] for tasks performed  [name] 09/05/2011, 11:38 AM  09/05/2011 [name] DPT PAGER: [phone] OFFICE: [phone]

## 2011-09-05 NOTE — Evaluation (Signed)
Occupational Therapy Evaluation Patient Details Name: Wesley Mendez MRN: [id number] DOB: [birth year] Today's Date: 09/05/2011  Problem List:  Patient Active Problem List  Diagnoses  . HYPERTENSION    Past Medical History:  Past Medical History  Diagnosis Date  . Complication of anesthesia     N&V- post nasal surgery-1980  . PONV (postoperative nausea and vomiting)     no N&V post shoulder surgery- 2010  . Peripheral vascular disease     post shoulder surgery- bld. clot in arm, treated with Coumadin   . GERD (gastroesophageal reflux disease)   . Chronic kidney disease     as a young adult, spontaneous passing   . Arthritis     lumbar congenital spondylolisthesis , radiculopathy, multiple areas of arthritis   . Gout   . H/O blood clots     shoulder   Past Surgical History:  Past Surgical History  Procedure Date  . Orif nasal fracture     1980- fr. playing soccer  . Shoulder arthroscopy w/ acromial repair     dislocated shoulder,everything repaired except RC,    . Eye surgery     lasik done- 2004  . Lumbar laminectomy     OT Assessment/Plan/Recommendation OT Assessment Clinical Impression Statement: Pt is a 51 year old man recovering from L5-S1 fusion who is independent in mobility and ADL at baseline and works full time.  Pt requires mod to min assist for mobility and ADL.  Will need OT in acute venue to instruct in back precautions during ADL, teach adaptations, and practice ADL transfers.  Do not anticipate OT need post d/c. OT Recommendation/Assessment: Patient will need skilled OT in the acute care venue OT Problem List: Decreased knowledge of use of DME or AE;Decreased knowledge of precautions;Impaired balance (sitting and/or standing);Pain OT Therapy Diagnosis : Generalized weakness;Acute pain OT Plan OT Frequency: Min 2X/week OT Treatment/Interventions: Self-care/ADL training;DME and/or AE instruction;Patient/family education OT Recommendation Follow Up  Recommendations: No OT follow up;Supervision/Assistance - 24 hour (initially) Equipment Recommended: 3 in 1 bedside comode Individuals Consulted Consulted and Agree with Results and Recommendations: Patient;Family member/caregiver Family Member Consulted: wife OT Goals Acute Rehab OT Goals OT Goal Formulation: With patient Time For Goal Achievement: 7 days ADL Goals Pt Will Perform Lower Body Bathing: with supervision;with adaptive equipment;Sitting, edge of bed;Sit to stand from bed ADL Goal: Lower Body Bathing - Progress: Goal set today Pt Will Perform Lower Body Dressing: with supervision;Sit to stand from bed;Sitting, bed;with adaptive equipment ADL Goal: Lower Body Dressing - Progress: Goal set today Pt Will Transfer to Toilet: with supervision;3-in-1;Maintaining back safety precautions (over toilet) ADL Goal: Toilet Transfer - Progress: Goal set today Pt Will Perform Tub/Shower Transfer: with supervision;Maintaining back safety precautions;Shower seat with back;Other (comment) (pt will use plastic porch chair at home) ADL Goal: Tub/Shower Transfer - Progress: Goal set today Miscellaneous OT Goals Miscellaneous OT Goal #1: Pt will generalize back precautions in ADL. OT Goal: Miscellaneous Goal #1 - Progress: Goal set today  OT Evaluation Precautions/Restrictions  Precautions Precautions: Back Precaution Comments: pt educated on 3/3 back precautions Required Braces or Orthoses: Yes Spinal Brace: Lumbar corset Restrictions Weight Bearing Restrictions: No Prior Functioning Home Living Lives With: Spouse Receives Help From: Family Type of Home: House Home Layout: Two level;Able to live on main level with bedroom/bathroom Alternate Level Stairs-Rails: Can reach both Alternate Level Stairs-Number of Steps: 12 Home Access: Stairs to enter Entrance Stairs-Rails: Left Entrance Stairs-Number of Steps: 5 Bathroom Shower/Tub: Tub/shower unit;Curtain;Walk-in shower (walkin shower  is  on lower level of home) Bathroom Toilet: Standard Bathroom Accessibility: Yes How Accessible: Accessible via walker Home Adaptive Equipment: Built-in shower seat Prior Function Level of Independence: Independent with homemaking with ambulation;Independent with basic ADLs;Independent with transfers;Independent with gait Able to Take Stairs?: Yes Driving: Yes Vocation: Full time employment Vocation Requirements: Walking, lifting, squatting Leisure: Hobbies-yes (Comment) ADL ADL Eating/Feeding: Performed;Independent Where Assessed - Eating/Feeding: Edge of bed Grooming: Simulated;Set up;Minimal assistance;Other (comment) (min guard) Where Assessed - Grooming: Standing at sink Upper Body Bathing: Simulated;Minimal assistance Where Assessed - Upper Body Bathing: Sitting, bed Lower Body Bathing: Simulated;Moderate assistance Where Assessed - Lower Body Bathing: Sit to stand from bed;Sitting, bed Upper Body Dressing: Performed;Set up Where Assessed - Upper Body Dressing: Sitting, chair Lower Body Dressing: Simulated;Moderate assistance;Other (comment) (demonstrated use of AE) Where Assessed - Lower Body Dressing: Sitting, bed;Sit to stand from bed Equipment Used: Long-handled shoe horn;Long-handled sponge;Reacher;Rolling walker;Sock aid Ambulation Related to ADLs: Ambulating with min assist and RW. ADL Comments: Will have wife available to assist for first week. Vision/Perception  Vision - History Patient Visual Report: No change from baseline Cognition Cognition Arousal/Alertness: Awake/alert Overall Cognitive Status: Appears within functional limits for tasks assessed Orientation Level: Oriented X4 Sensation/Coordination Coordination Gross Motor Movements are Fluid and Coordinated: Yes Fine Motor Movements are Fluid and Coordinated: Yes Extremity Assessment RUE Assessment RUE Assessment: Within Functional Limits LUE Assessment LUE Assessment: Within Functional Limits Mobility   Bed Mobility Bed Mobility: No Transfers Transfers: Yes Sit to Stand: With upper extremity assist;From bed;From elevated surface;3: Mod assist Stand to Sit: 4: Min assist;With upper extremity assist;To bed;To elevated surface End of Session OT - End of Session Equipment Utilized During Treatment: Gait belt Activity Tolerance: Patient tolerated treatment well Patient left: in bed;with call bell in reach;with family/visitor present General Behavior During Session: [facility] for tasks performed Cognition: [facility] for tasks performed   Evern Bio 09/05/2011, 1:24 PM

## 2011-09-05 NOTE — Progress Notes (Signed)
Postop day 1. Feeling better. No lower extremity pain. Preoperative numbness and weakness have resolved.  Afebrile with stable vitals. Urine output good. Awake and alert. Oriented and appropriate. Cranial nerve function is intact. Motor and sensory function of the extremities is normal. Wound dressing clean and dry. Chest and abdomen benign.  Doing well postop. We'll discontinue PCA. Mobilize. Possible discharge in the morning.

## 2011-09-06 LAB — PROTIME-INR
INR: 1.17 (ref 0.00–1.49)
Prothrombin Time: 15.1 seconds (ref 11.6–15.2)

## 2011-09-06 NOTE — Progress Notes (Signed)
Postop day 2. Patient reports generalized aches all over. Some headache which is not postural. No radiating pain. Back pain well controlled. No anomalous symptoms.  Patient is afebrile. His vitals are stable. Urine output is good. Motor and sensory examination of the extremities is normal. Dressing is dry. Abdomen soft.  INR up to 1.17. Continue Coumadin.  Progressing reasonably well following lumbar decompression and fusion. Increased symptoms likely secondary to postoperative inflammation. Continue efforts at mobilization. Suspect patient will be able to be discharged Monday or Tuesday.

## 2011-09-06 NOTE — Progress Notes (Signed)
Physical Therapy Treatment Patient Details Name: Wesley Mendez MRN: [id number] DOB: July 31, 1960 Today's Date: 09/06/2011  PT Assessment/Plan  PT - Assessment/Plan Comments on Treatment Session: Pt complained of headache with upright posturing therefore limited session. Pt still has difficulty standing from lower surfaces, pt encouraged throughout session for forward movements as well as tactile cues. Will attempt stairs tomorrow prior to d/c pending pt progress and pain.  PT Plan: Discharge plan remains appropriate;Frequency remains appropriate PT Frequency: Min 5X/week Follow Up Recommendations: No PT follow up;Supervision - Intermittent Equipment Recommended: 3 in 1 bedside comode;None recommended by PT PT Goals  Acute Rehab PT Goals PT Goal Formulation: With patient/family Time For Goal Achievement: 7 days PT Goal: Supine/Side to Sit - Progress: Progressing toward goal PT Goal: Sit to Supine/Side - Progress: Progressing toward goal PT Goal: Sit to Stand - Progress: Progressing toward goal PT Goal: Stand to Sit - Progress: Progressing toward goal PT Goal: Ambulate - Progress: Progressing toward goal  PT Treatment Precautions/Restrictions  Precautions Precautions: Back Precaution Comments: Pt able to recall 3/3 back precautions Required Braces or Orthoses: Yes Spinal Brace: Lumbar corset Restrictions Weight Bearing Restrictions: No Mobility (including Balance) Bed Mobility Bed Mobility: Yes Rolling Right: 5: Supervision Rolling Right Details (indicate cue type and reason): VC for sequencing and safety. No physical assist Right Sidelying to Sit: 5: Supervision Right Sidelying to Sit Details (indicate cue type and reason): VC for proper technique and safety with back precautions. Sitting - Scoot to Edge of Bed: 6: Modified independent (Device/Increase time) Transfers Transfers: Yes Sit to Stand: 4: Min assist;With upper extremity assist;From bed;From chair/3-in-1;From elevated  surface Sit to Stand Details (indicate cue type and reason): VC for forward translation and technique into standing.  Stand to Sit: 4: Min assist;With upper extremity assist;To chair/3-in-1 Stand to Sit Details: VC for hand placement and forward translation Ambulation/Gait Ambulation/Gait: Yes Ambulation/Gait Assistance: 4: Min assist (Minguard assist) Ambulation/Gait Assistance Details (indicate cue type and reason): VC for safety with RW for distance as well as proper technique. Pt complained of RLE leg pain and encouraged pt to bear weight through UEs while weight bearing through RLE.  Ambulation Distance (Feet): 90 Feet Assistive device: Rolling walker Gait Pattern: Step-to pattern;Decreased hip/knee flexion - right;Decreased hip/knee flexion - left;Decreased stride length;Decreased weight shift to right Gait velocity: Decreased gait speed Stairs: No    Exercise    End of Session PT - End of Session Equipment Utilized During Treatment: Gait belt Activity Tolerance: Patient tolerated treatment well Patient left: in chair;with call bell in reach;with family/visitor present Nurse Communication: Mobility status for transfers;Mobility status for ambulation General Behavior During Session: [facility] for tasks performed Cognition: [facility] for tasks performed  Wesley Mendez 09/06/2011, 10:55 AM  09/06/2011 Wesley Mendez DPT PAGER: [phone] OFFICE: [phone]

## 2011-09-07 MED ORDER — OXYCODONE-ACETAMINOPHEN 5-325 MG PO TABS: 1.0000 | ORAL_TABLET | ORAL | Status: AC | PRN

## 2011-09-07 MED ORDER — WARFARIN SODIUM 5 MG PO TABS: 5.0000 mg | ORAL_TABLET | Freq: Every day | ORAL | Status: DC

## 2011-09-07 MED ORDER — DIAZEPAM 5 MG PO TABS: 5.0000 mg | ORAL_TABLET | Freq: Four times a day (QID) | ORAL | Status: AC | PRN

## 2011-09-07 NOTE — Progress Notes (Signed)
Physical Therapy Treatment Patient Details Name: Wesley Mendez MRN: [id number] DOB: 09-16-1960 Today's Date: 09/07/2011  PT Assessment/Plan  PT - Assessment/Plan Comments on Treatment Session: Pt moving well despite pain in Rt. knee.  Pt feels like it is gout.  Did really well on stairs.   PT Plan: Discharge plan remains appropriate PT Frequency: Min 5X/week Follow Up Recommendations: No PT follow up;Supervision - Intermittent Equipment Recommended: 3 in 1 bedside comode;None recommended by PT PT Goals  Acute Rehab PT Goals PT Goal: Sit to Stand - Progress: Progressing toward goal PT Goal: Stand to Sit - Progress: Progressing toward goal PT Goal: Ambulate - Progress: Progressing toward goal PT Goal: Up/Down Stairs - Progress: Met  PT Treatment Precautions/Restrictions  Precautions Precautions: Back Precaution Comments: Pt able to recall 3/3 back precautions Required Braces or Orthoses: Yes Spinal Brace: Lumbar corset Restrictions Weight Bearing Restrictions: No Mobility (including Balance) Bed Mobility Bed Mobility: No Transfers Sit to Stand: 5: Supervision;From chair/3-in-1;With upper extremity assist;With armrests Sit to Stand Details (indicate cue type and reason): VC for scooting hips closer to edge of surface before standing.   Stand to Sit: 5: Supervision Stand to Sit Details: Supervision for safety.   Ambulation/Gait Ambulation/Gait Assistance: 5: Supervision Ambulation/Gait Assistance Details (indicate cue type and reason): Supervision for safety due to Rt knee pain (?gout per pt).  Cues to stay inside RW & use of UE's to offset weight bearing on RLE.   Ambulation Distance (Feet): 150 Feet Assistive device: Rolling walker Gait Pattern: Step-through pattern;Decreased hip/knee flexion - right;Decreased stance time - right Stairs: Yes Stairs Assistance: 5: Supervision Stairs Assistance Details (indicate cue type and reason): cues for technique & use of UE's to offset  weight bearing on RLE due to pain  Stair Management Technique: Sideways;One rail Left Number of Stairs: 3  (2 trials. ) Wheelchair Mobility Wheelchair Mobility: No  Posture/Postural Control Posture/Postural Control: No significant limitations Exercise    End of Session PT - End of Session Equipment Utilized During Treatment: Gait belt;Back brace Activity Tolerance: Patient tolerated treatment well Patient left: in chair;with call bell in reach;with family/visitor present General Behavior During Session: [facility] for tasks performed Cognition: [facility] for tasks performed  Lara Mulch 09/07/2011, 11:35 AM [phone]

## 2011-09-07 NOTE — Discharge Summary (Signed)
Physician Discharge Summary  Patient ID: Wesley Mendez MRN: [id number] DOB/AGE: [birth year] 51 y.o.  Admit date: 09/04/2011 Discharge date: 09/07/2011  Admission Diagnoses: Spondylolisthesis L5-S1 with severe lumbar radiculopathy. History of upper extremity deep venous thrombosis Gout right knee  Discharge Diagnoses: Spondylolisthesis L5-S1 with severe lumbar radiculopathy. History of upper extremity deep venous thrombosis  Active Problems:  * No active hospital problems. *    Discharged Condition: good  Hospital Course: Patient was admitted to undergo surgical decompression of a grade 1 spondylolisthesis at L5-S1 but caused severe L5 radiculopathy. He had worse left lower extremity symptoms than right. He underwent surgical decompression and tolerated this well. During the postoperative course he experienced a recurrence of his gout. This was treated medically. The patient had a history of upper extremity deep venous thrombosis and was advised regarding anticoagulation. He is started on Coumadin anticoagulation at 5 mg a day. He's been advised to contact his primary care physician regarding continuation of anticoagulation for the next 3 months time.  Consults: None  Significant Diagnostic Studies: MRI demonstrating severe L5 nerve root stenosis secondary to spondylolisthesis  Treatments: surgery: Bilateral decompression of L5-S1 via a total laminectomy of L5-1 decompression of L5 and S1 nerve roots posterior lumbar interbody arthrodesis with peek spacers local autograft and allograft, pedicle screw fixation L5-S1 with posterior lateral arthrodesis L5-S1  Discharge Exam: Blood pressure 121/77, pulse 80, temperature 97.4 F (36.3 C), temperature source Oral, resp. rate 18, height 6\' 2"  (1.88 m), weight 103.9 kg (229 lb 0.9 oz), SpO2 94.00%. Motor function intact in the iliopsoas quadriceps tibialis anterior and gastrocs, station and gait normal. Incision clean and dry. Sensation normal in  the lower extremities.  Disposition: Discharge home  Discharge Orders    Future Orders Please Complete By Expires   Diet - low sodium heart healthy      Increase activity slowly      Discharge instructions      Comments:   Okay to shower without brace on. Sit straight walk straight stand straight. Mind your posture. Okay to drive when not requiring pain medication. Contact her primary care physician regarding checking INR for Coumadin dosing.   Call MD for:  temperature >100.4      Call MD for:  severe uncontrolled pain      Call MD for:  redness, tenderness, or signs of infection (pain, swelling, redness, odor or green/yellow discharge around incision site)        Medication List  As of 09/07/2011  9:57 PM   TAKE these medications         diazepam 5 MG tablet   Commonly known as: VALIUM   Take 1 tablet (5 mg total) by mouth every 6 (six) hours as needed (Muscle spasms).      ibuprofen 200 MG tablet   Commonly known as: ADVIL,MOTRIN   Take 200 mg by mouth every 6 (six) hours as needed.      omeprazole 20 MG tablet   Commonly known as: PRILOSEC OTC   Take 20 mg by mouth every morning.      OVER THE COUNTER MEDICATION   Take 1 tablet by mouth daily. Takes OTC medication for gout- GOUT cure- [employer]      oxyCODONE-acetaminophen 5-325 MG per tablet   Commonly known as: PERCOCET   Take 1-2 tablets by mouth every 3 (three) hours as needed for pain.      warfarin 5 MG tablet   Commonly known as: COUMADIN   Take  1 tablet (5 mg total) by mouth daily at 6 PM.             Signed: Stefani Dama 09/07/2011, 9:57 PM

## 2011-09-07 NOTE — Progress Notes (Signed)
Occupational Therapy Treatment Patient Details Name: Wesley Mendez MRN: [id number] DOB: 1961-01-12 Today's Date: 09/07/2011  OT Assessment/Plan OT Assessment/Plan OT Frequency: Min 2X/week Follow Up Recommendations: No OT follow up Equipment Recommended: 3 in 1 bedside comode;Other (comment) (REQUESTS ELONGATED SEAT FOR COMFORT) OT Goals Acute Rehab OT Goals Time For Goal Achievement: 7 days ADL Goals ADL Goal: Lower Body Bathing - Progress: Progressing toward goals ADL Goal: Lower Body Dressing - Progress: Progressing toward goals ADL Goal: Toilet Transfer - Progress: Progressing toward goals Miscellaneous OT Goals OT Goal: Miscellaneous Goal #1 - Progress: Progressing toward goals  OT Treatment Precautions/Restrictions  Precautions Precautions: Back Required Braces or Orthoses: Yes Spinal Brace: Lumbar corset Restrictions Weight Bearing Restrictions: No   ADL ADL Grooming: Performed;Wash/dry hands;Teeth care;Minimal assistance Grooming Details (indicate cue type and reason): cues for maintaining back precautions, and walker safety while standing at the sink Where Assessed - Grooming: Standing at sink Lower Body Bathing: Simulated;Supervision/safety Lower Body Bathing Details (indicate cue type and reason): with use of A/E Where Assessed - Lower Body Bathing: Sitting, chair Upper Body Dressing: Other (comment) (reviewed donning shirt without arching) Lower Body Dressing: Performed;Modified independent;Other (comment) (able to demonstrate proper use of A/E for lb dressing) Where Assessed - Lower Body Dressing: Sitting, chair Toilet Transfer: Performed;Supervision/safety Toilet Transfer Method: Proofreader: Raised toilet seat with arms (or 3-in-1 over toilet) Toileting - Clothing Manipulation: Performed;Minimal assistance Where Assessed - Toileting Clothing Manipulation: Sit to stand from 3-in-1 or toilet Toileting - Hygiene:  Performed;Supervision/safety Where Assessed - Toileting Hygiene: Sit to stand from 3-in-1 or toilet Equipment Used: Long-handled sponge;Long-handled shoe horn;Reacher;Sock aid;Rolling walker Ambulation Related to ADLs: ambulated with min a with rw from b.room to sink, to chair in room ADL Comments: wife present and was assisting as needed Mobility  Bed Mobility Bed Mobility: No Transfers Transfers: Yes Sit to Stand: 5: Supervision;With upper extremity assist;With armrests;From chair/3-in-1 Sit to Stand Details (indicate cue type and reason): VC for scooting hips closer to edge of surface before standing.   Stand to Sit: 5: Supervision;With upper extremity assist;With armrests;To chair/3-in-1 Stand to Sit Details: Supervision for safety.   Exercises    End of Session OT - End of Session Equipment Utilized During Treatment: Back brace Activity Tolerance: Patient tolerated treatment well Patient left: in chair;with call bell in reach;with family/visitor present General Behavior During Session: [facility] for tasks performed Cognition: [facility] for tasks performed  Robet Leu COTA/L 09/07/2011, 11:39 AM

## 2011-09-07 NOTE — Progress Notes (Signed)
   CARE MANAGEMENT NOTE 09/07/2011  Patient:  Wesley Mendez, Wesley Mendez   Account Number:  [id number]  Date Initiated:  09/07/2011  Documentation initiated by:  GRAVES-BIGELOW,Lennan Malone  Subjective/Objective Assessment:   L5-S1 decompressive laminectomy decompression of L5 and S1 nerve roots, posterior lumbar interbody arthrodesis with peek spacers local autograft and allograft, pedicle screw fixation L 5 S1, posterior lateral arthrodesis L5-S1. Pt frm home.     Action/Plan:   Plan for d/c today with DME. CM made referral with dme to be delivered to room before d/c. DME elongated toilet seat. No HH services needed.   Anticipated DC Date:  09/07/2011   Anticipated DC Plan:  HOME/SELF CARE      DC Planning Services  CM consult      PAC Choice  DURABLE MEDICAL EQUIPMENT   Choice offered to / List presented to:  C-1 Patient   DME arranged  WALKER - ROLLING  OTHER - SEE COMMENT           Status of service:  Completed, signed off Medicare Important Message given?   (If response is "NO", the following Medicare IM given date fields will be blank) Date Medicare IM given:   Date Additional Medicare IM given:    Discharge Disposition:  HOME/SELF CARE  Per UR Regulation:    Comments:

## 2011-09-07 NOTE — Progress Notes (Signed)
Patient ID: Wesley Mendez, male   DOB: April 09, 1961, 51 y.o.   MRN: [id number] Patient has been ambulating this afternoon wishes to go home we'll plan discharge for a.m.

## 2011-09-07 NOTE — Progress Notes (Signed)
Subjective: Patient reports Back feels good lower extremities feel well, right knee gout it is flaring up.  Objective: Vital signs in last 24 hours: Temp:  [98 F (36.7 C)-99 F (37.2 C)] 98.3 F (36.8 C) (02/11 1408) Pulse Rate:  [62-74] 74  (02/11 1408) Resp:  [17-20] 18  (02/11 1408) BP: (108-135)/(64-86) 108/64 mmHg (02/11 1408) SpO2:  [90 %-97 %] 97 % (02/11 1408) Weight:  [103.9 kg (229 lb 0.9 oz)] 103.9 kg (229 lb 0.9 oz) (02/10 2200)  Intake/Output from previous day: 02/10 0701 - 02/11 0700 In: -  Out: 2050 [Urine:2050] Intake/Output this shift: Total I/O In: 600 [P.O.:600] Out: 500 [Urine:500]  Incision is clean and dry motor function good and iliopsoas quadricep tibialis anterior and gastroc. Right knee with substantial edema warmth and erythema with painful movement.  Lab Results: No results found for this basename: WBC:2,HGB:2,HCT:2,PLT:2 in the last 72 hours BMET No results found for this basename: NA:2,K:2,CL:2,CO2:2,GLUCOSE:2,BUN:2,CREATININE:2,CALCIUM:2 in the last 72 hours  Studies/Results: No results found.  Assessment/Plan: Patient uses an over-the-counter supplement to treat his scalp which is usually quite successful. He is now been receiving this supplement during the hospital stay. I've advised the patient to bring supplement to the hospital and start taking it. He does not do well with nonsteroidal anti-inflammatories. Specifically he has previously been on Indocin and he notes that this only  minimally moderates the pain..  LOS: 3 days  Maintain in hospital today until gouty condition improves.   Ellanora Rayborn J 09/07/2011, 2:50 PM

## 2011-09-08 NOTE — Progress Notes (Signed)
Utilization review completed. Maddalynn Barnard, RN, BSN. 09/08/11 

## 2011-09-08 NOTE — Progress Notes (Signed)
Physical Therapy Treatment Patient Details Name: Wesley Mendez MRN: [id number] DOB: 24-Nov-1960 Today's Date: 09/08/2011  PT Assessment/Plan  PT - Assessment/Plan Comments on Treatment Session: Pt moving very well.  Mod I level for supine>sit, sit<>stand transfers, ambulation, & stairs.  Pt continues to c/o Rt knee pain (?gout per pt) but it is improving.  Plans are to d/c home today.   PT Plan: Discharge plan remains appropriate PT Frequency: Min 5X/week Follow Up Recommendations: No PT follow up PT Goals  Acute Rehab PT Goals PT Goal: Supine/Side to Sit - Progress: Met PT Goal: Sit to Stand - Progress: Met PT Goal: Stand to Sit - Progress: Met PT Goal: Ambulate - Progress: Met PT Goal: Up/Down Stairs - Progress: Met  PT Treatment Precautions/Restrictions  Precautions Precautions: Back Precaution Comments: Verbalizes 3/3 back precautions & does well with maintaining adherence to precautions during functional activity.   Required Braces or Orthoses: Yes Spinal Brace: Lumbar corset;Applied in sitting position Restrictions Weight Bearing Restrictions: No Mobility (including Balance) Bed Mobility Rolling Right: 6: Modified independent (Device/Increase time) Right Sidelying to Sit: 6: Modified independent (Device/Increase time);HOB flat Sitting - Scoot to Edge of Bed: 6: Modified independent (Device/Increase time) Transfers Sit to Stand: 6: Modified independent (Device/Increase time);From bed;From elevated surface Stand to Sit: 6: Modified independent (Device/Increase time);To chair/3-in-1;With armrests;With upper extremity assist;To elevated surface Ambulation/Gait Ambulation/Gait Assistance: 6: Modified independent (Device/Increase time) Ambulation/Gait Assistance Details (indicate cue type and reason): Pt states that if it wasnt for his knee hurting him that he feels as though he could ambulate without use of RW.   Ambulation Distance (Feet): 300 Feet Assistive device: Rolling  walker Gait Pattern: Step-through pattern;Decreased stance time - right;Decreased hip/knee flexion - right Stairs: Yes Stairs Assistance: 6: Modified independent (Device/Increase time) Stairs Assistance Details (indicate cue type and reason): Pt able to return demonstration of proper technique without any cues or assistance.   Stair Management Technique: One rail Left;Sideways;Step to pattern Number of Stairs: 3  Wheelchair Mobility Wheelchair Mobility: No  Posture/Postural Control Posture/Postural Control: No significant limitations Balance Balance Assessed: Yes Static Standing Balance Static Standing - Balance Support: No upper extremity supported Static Standing - Level of Assistance: 5: Stand by assistance Static Standing - Comment/# of Minutes: pt with decreased weight shifting to Rt side initially due to knee pain but eventually evenly distributed weight through both legs.   Exercise    End of Session PT - End of Session Equipment Utilized During Treatment: Gait belt;Back brace Activity Tolerance: Patient tolerated treatment well Patient left: in chair;with call bell in reach General Behavior During Session: [facility] for tasks performed Cognition: [facility] for tasks performed  Lara Mulch 09/08/[phone]

## 2011-09-09 MED FILL — Sodium Chloride IV Soln 0.9%: INTRAVENOUS | Qty: 1000 | Status: AC

## 2011-09-09 MED FILL — Heparin Sodium (Porcine) Inj 1000 Unit/ML: INTRAMUSCULAR | Qty: 1 | Status: AC

## 2011-12-04 ENCOUNTER — Ambulatory Visit: Payer: 59 | Attending: Neurological Surgery | Admitting: Physical Therapy

## 2011-12-04 DIAGNOSIS — M6281 Muscle weakness (generalized): Secondary | ICD-10-CM | POA: Insufficient documentation

## 2011-12-04 DIAGNOSIS — IMO0001 Reserved for inherently not codable concepts without codable children: Secondary | ICD-10-CM | POA: Insufficient documentation

## 2011-12-04 DIAGNOSIS — M545 Low back pain, unspecified: Secondary | ICD-10-CM | POA: Insufficient documentation

## 2011-12-07 ENCOUNTER — Ambulatory Visit: Payer: 59 | Admitting: Physical Therapy

## 2011-12-09 ENCOUNTER — Encounter: Payer: 59 | Admitting: Physical Therapy

## 2011-12-10 ENCOUNTER — Ambulatory Visit: Payer: 59 | Admitting: Physical Therapy

## 2011-12-14 ENCOUNTER — Ambulatory Visit: Payer: 59 | Admitting: Physical Therapy

## 2011-12-17 ENCOUNTER — Ambulatory Visit: Payer: 59 | Admitting: Physical Therapy

## 2011-12-30 ENCOUNTER — Ambulatory Visit: Payer: 59 | Attending: Neurological Surgery | Admitting: Physical Therapy

## 2011-12-30 DIAGNOSIS — M6281 Muscle weakness (generalized): Secondary | ICD-10-CM | POA: Insufficient documentation

## 2011-12-30 DIAGNOSIS — M545 Low back pain, unspecified: Secondary | ICD-10-CM | POA: Insufficient documentation

## 2011-12-30 DIAGNOSIS — IMO0001 Reserved for inherently not codable concepts without codable children: Secondary | ICD-10-CM | POA: Insufficient documentation

## 2012-01-01 ENCOUNTER — Ambulatory Visit: Payer: 59 | Admitting: Physical Therapy

## 2012-01-05 ENCOUNTER — Ambulatory Visit: Payer: 59 | Admitting: Physical Therapy

## 2012-01-07 ENCOUNTER — Ambulatory Visit: Payer: 59 | Admitting: Physical Therapy

## 2012-01-11 ENCOUNTER — Ambulatory Visit: Payer: 59 | Admitting: Physical Therapy

## 2012-01-13 ENCOUNTER — Ambulatory Visit: Payer: 59 | Admitting: Physical Therapy

## 2012-01-19 ENCOUNTER — Ambulatory Visit: Payer: 59 | Admitting: Physical Therapy

## 2012-01-21 ENCOUNTER — Ambulatory Visit: Payer: 59 | Admitting: Physical Therapy

## 2012-01-26 ENCOUNTER — Ambulatory Visit: Payer: 59 | Attending: Neurological Surgery | Admitting: Physical Therapy

## 2012-01-26 DIAGNOSIS — M6281 Muscle weakness (generalized): Secondary | ICD-10-CM | POA: Insufficient documentation

## 2012-01-26 DIAGNOSIS — M545 Low back pain, unspecified: Secondary | ICD-10-CM | POA: Insufficient documentation

## 2012-01-26 DIAGNOSIS — IMO0001 Reserved for inherently not codable concepts without codable children: Secondary | ICD-10-CM | POA: Insufficient documentation

## 2012-01-29 ENCOUNTER — Ambulatory Visit: Payer: 59 | Admitting: Physical Therapy

## 2012-02-02 ENCOUNTER — Encounter: Payer: 59 | Admitting: Physical Therapy

## 2012-10-30 IMAGING — RF DG LUMBAR SPINE 2-3V
1 series · 2 of 2 positions shown · non-contrast
Comparison: Intraoperative film 09/04/2011.

CLINICAL DATA: Lumbar fusion.

LUMBAR SPINE - 2-3 VIEW

[Series 1: run · 2 of 2 slices shown]
[im 1/2]
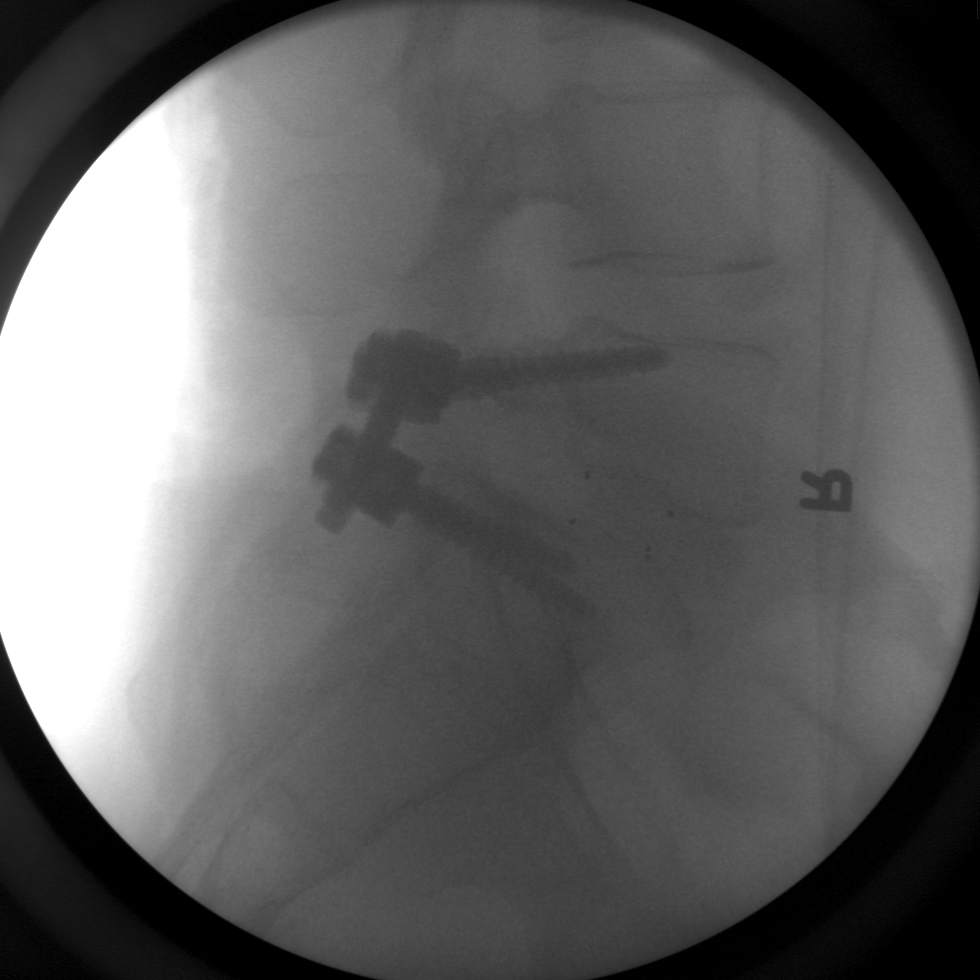
[im 2/2]
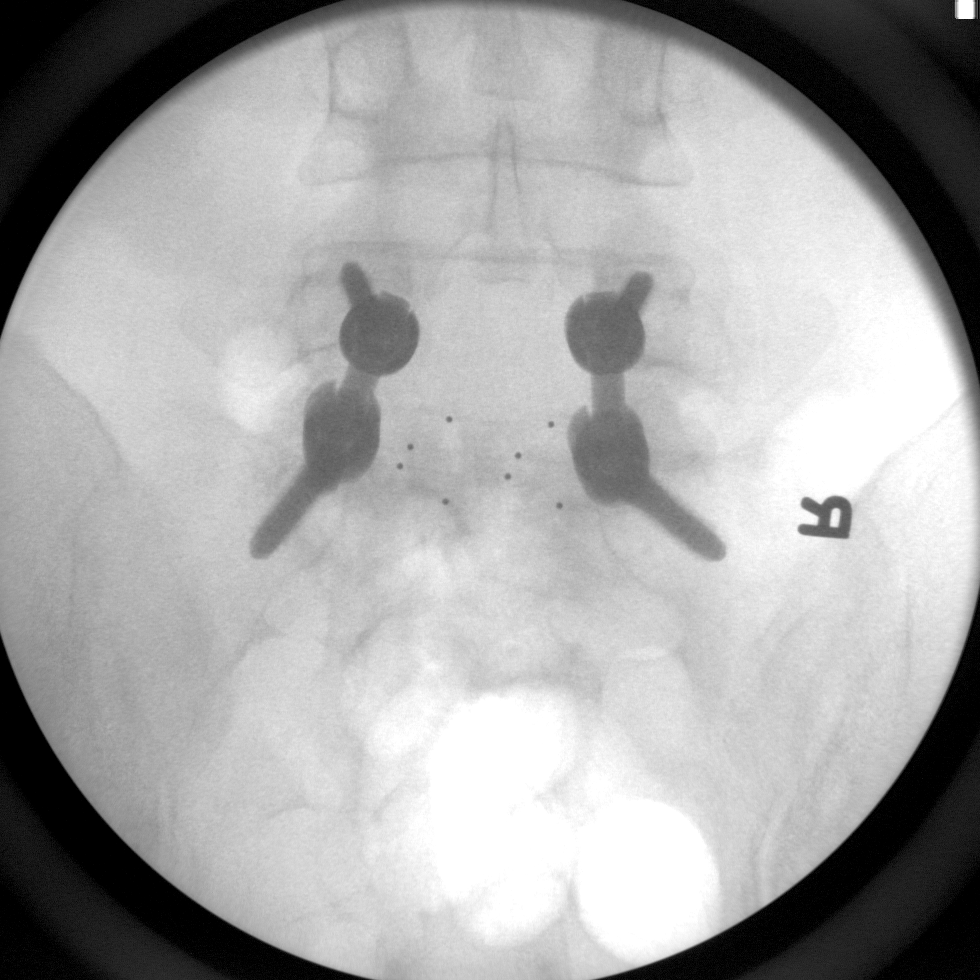

[2 of 2 positions shown; findings below may reference images not displayed]

FINDINGS: Intraoperative fluoro spot images demonstrates L5-S1
PLIF.  Bilateral pedicle screws are in place.  Alignment is stable.
IMPRESSION: Status post L5-S1 PLIF without radiographic evidence for
complication.

## 2014-03-08 LAB — HM COLONOSCOPY

## 2016-08-06 ENCOUNTER — Encounter: Payer: Self-pay | Admitting: Internal Medicine

## 2016-09-15 ENCOUNTER — Encounter: Payer: Self-pay | Admitting: *Deleted

## 2016-09-30 ENCOUNTER — Ambulatory Visit (INDEPENDENT_AMBULATORY_CARE_PROVIDER_SITE_OTHER): Payer: Commercial Managed Care - HMO | Admitting: Internal Medicine

## 2016-09-30 ENCOUNTER — Encounter (INDEPENDENT_AMBULATORY_CARE_PROVIDER_SITE_OTHER): Payer: Self-pay

## 2016-09-30 ENCOUNTER — Encounter: Payer: Self-pay | Admitting: Internal Medicine

## 2016-09-30 VITALS — BP 126/88 | HR 68 | Ht 74.0 in | Wt 242.1 lb

## 2016-09-30 DIAGNOSIS — K219 Gastro-esophageal reflux disease without esophagitis: Secondary | ICD-10-CM | POA: Diagnosis not present

## 2016-09-30 DIAGNOSIS — Z86010 Personal history of colonic polyps: Secondary | ICD-10-CM | POA: Diagnosis not present

## 2016-09-30 MED ORDER — OMEPRAZOLE 40 MG PO CPDR: 40.0000 mg | DELAYED_RELEASE_CAPSULE | Freq: Every day | ORAL | 2 refills | Status: DC

## 2016-09-30 NOTE — Progress Notes (Signed)
Patient ID: Larita FifeCarmen W Folger, male   DOB: 04/21/1961, 56 y.o.   MRN: [id number] HPI: Elyse HsuCarmen Deal is a 56 year old male with a past medical history of GERD, colon polyps, arthritis, gout, who is seen in consultation at the request of Dr. Lillie ColumbiaNifong to evaluate reflux and regurgitation. He is here alone today.  He reports over the last 4 or 5 years he's been having trouble with nocturnal regurgitation and reflux symptoms. This is often associated with acid taste in the back of his mouth and he will wake up with coughing, choking. This requires throat clearing and can at times lead to vomiting. He does not have heartburn or reflux symptoms during the day nor has he ever. He is sleeping on a wedge and trying to not eat within a few hours of lying down. This has made a considerable difference. He is currently taking omeprazole 40 mg a day. Previously he was on 20 mg and since increasing the dose he feels that symptoms have improved. He's only had one such episode which was "milder". He denies dysphagia and odynophagia. He denies epigastric pain. Appetite has been good. Bowel movements regular without blood or melena.  He [place] 56 [place] He forgot the practice name. He recalls 2 benign polyps being removed and he was recommended to repeat the exam in 5 years  He denies a family history of colorectal cancer. His mother did have benign but precancerous colon polyps. No family history of IBD.  Past Medical History:  Diagnosis Date  . Arthritis    lumbar congenital spondylolisthesis , radiculopathy, multiple areas of arthritis   . Chronic kidney disease    as a young adult, spontaneous passing   . Colon polyps   . Complication of anesthesia    N&V- post nasal surgery-1980  . Elevated liver function tests   . GERD (gastroesophageal reflux disease)   . Gout   . H/O blood clots    shoulder  . Peripheral vascular disease (HCC)    post shoulder surgery-  bld. clot in arm, treated with Coumadin   . PONV (postoperative nausea and vomiting)    no N&V post shoulder surgery- 2010    Past Surgical History:  Procedure Laterality Date  . LASIK Bilateral    2004  . LUMBAR FUSION  2013   L5  . ORIF NASAL FRACTURE     1980- fr. playing soccer  . SHOULDER ARTHROSCOPY W/ ACROMIAL REPAIR Right    dislocated shoulder,everything repaired except RC,      Outpatient Medications Prior to Visit  Medication Sig Dispense Refill  . ibuprofen (ADVIL,MOTRIN) 200 MG tablet Take 200 mg by mouth every 6 (six) hours as needed.    Marland Kitchen. omeprazole (PRILOSEC OTC) 20 MG tablet Take 20 mg by mouth every morning.     Marland Kitchen. OVER THE COUNTER MEDICATION Take 1 tablet by mouth daily. Takes OTC medication for gout- GOUT cure- [employer]    . warfarin (COUMADIN) 5 MG tablet Take 1 tablet (5 mg total) by mouth daily at 6 PM. 30 tablet 5   No facility-administered medications prior to visit.     No Known Allergies  Family History  Problem Relation Age of Onset  . Colon polyps Mother   . Heart disease Maternal Grandmother   . Heart disease Paternal Grandmother   . Breast cancer Maternal Aunt   . Anesthesia problems Neg Hx   . Hypotension Neg Hx   . Malignant  hyperthermia Neg Hx   . Pseudochol deficiency Neg Hx     Social History  Substance Use Topics  . Smoking status: Never Smoker  . Smokeless tobacco: Never Used  . Alcohol use No    ROS: As per history of present illness, otherwise negative  BP 126/88 (BP Location: Left Arm, Patient Position: Sitting, Cuff Size: Normal)   Pulse 68   Ht 6\' 2"  (1.88 m) Comment: height measured without shoes  Wt 242 lb 2 oz (109.8 kg)   BMI 31.09 kg/m  Constitutional: Well-developed and well-nourished. No distress. HEENT: Normocephalic and atraumatic. Oropharynx is clear and moist. No oropharyngeal exudate. Conjunctivae are normal.  No scleral icterus. Neck: Neck supple. Trachea midline. Cardiovascular: Normal rate,  regular rhythm and intact distal pulses. No M/R/G Pulmonary/chest: Effort normal and breath sounds normal. No wheezing, rales or rhonchi. Abdominal: Soft, nontender, nondistended. Bowel sounds active throughout. There are no masses palpable. No hepatosplenomegaly. Extremities: no clubbing, cyanosis, or edema Neurological: Alert and oriented to person place and time. Skin: Skin is warm and dry. No rashes noted. Psychiatric: Normal mood and affect. Behavior is normal.  ASSESSMENT/PLAN: 56 year old male with a past medical history of GERD, colon polyps, arthritis, gout, who is seen in consultation at the request of Dr. Lillie Columbia to evaluate reflux and regurgitation.  1. GERD -- symptoms can system with GERD, particular symptomatic at night. Omeprazole at 40 mg seems to be helping. Due to the nocturnal nature of his symptoms I have recommended that he move omeprazole 40 mg to 30 minutes to an hour before the last meal of the day. I've also recommended he continue lifestyle modification such as elevating the head of his bed, not eating or drinking within 2 hours of lying down and avoiding large meals and overeating. So recommended upper endoscopy to screen for Barrett's esophagus and evaluate his symptoms. We discussed the risk, benefit and alternatives and he wishes to proceed.  2. History of colon polyps -- 5 year surveillance interval, recall colonoscopy October 2019   Cc: Stefanie Libel, MD

## 2016-09-30 NOTE — Patient Instructions (Signed)
You have been scheduled for an endoscopy. Please follow written instructions given to you at your visit today. If you use inhalers (even only as needed), please bring them with you on the day of your procedure. Your physician has requested that you go to www.startemmi.com and enter the access code given to you at your visit today. This web site gives a general overview about your procedure. However, you should still follow specific instructions given to you by our office regarding your preparation for the procedure.  We have sent the following medications to your pharmacy for you to pick up at your convenience: Omeprazole 40 mg before supper  If you are age 54 or older, your body mass index should be between 23-30. Your Body mass index is 31.09 kg/m. If this is out of the aforementioned range listed, please consider follow up with your Primary Care Provider.  If you are age 54 or younger, your body mass index should be between 19-25. Your Body mass index is 31.09 kg/m. If this is out of the aformentioned range listed, please consider follow up with your Primary Care Provider.

## 2016-10-13 ENCOUNTER — Encounter: Payer: Self-pay | Admitting: Internal Medicine

## 2016-10-20 ENCOUNTER — Ambulatory Visit (AMBULATORY_SURGERY_CENTER): Payer: Commercial Managed Care - HMO | Admitting: Internal Medicine

## 2016-10-20 ENCOUNTER — Encounter: Payer: Self-pay | Admitting: Internal Medicine

## 2016-10-20 VITALS — BP 120/80 | HR 47 | Temp 98.9°F | Resp 14 | Ht 74.0 in | Wt 242.0 lb

## 2016-10-20 DIAGNOSIS — K2270 Barrett's esophagus without dysplasia: Secondary | ICD-10-CM | POA: Diagnosis not present

## 2016-10-20 DIAGNOSIS — K219 Gastro-esophageal reflux disease without esophagitis: Secondary | ICD-10-CM

## 2016-10-20 MED ORDER — SODIUM CHLORIDE 0.9 % IV SOLN: 500.0000 mL | INTRAVENOUS | Status: DC

## 2016-10-20 NOTE — Progress Notes (Signed)
Patient Wannetta Senderawakenng,vss,report to [facility]

## 2016-10-20 NOTE — Patient Instructions (Signed)
YOU HAD AN ENDOSCOPIC PROCEDURE TODAY: Refer to the procedure report and other information in the discharge instructions given to you for any specific questions about what was found during the examination. If this information does not answer your questions, please call [place] office at [phone] to clarify.   YOU SHOULD EXPECT: Some feelings of bloating in the abdomen. Passage of more gas than usual. Walking can help get rid of the air that was put into your GI tract during the procedure and reduce the bloating. If you had a lower endoscopy (such as a colonoscopy or flexible sigmoidoscopy) you may notice spotting of blood in your stool or on the toilet paper. Some abdominal soreness may be present for a day or two, also.  DIET: Your first meal following the procedure should be a light meal and then it is ok to progress to your normal diet. A half-sandwich or bowl of soup is an example of a good first meal. Heavy or fried foods are harder to digest and may make you feel nauseous or bloated. Drink plenty of fluids but you should avoid alcoholic beverages for 24 hours. If you had a esophageal dilation, please see attached instructions for diet.    ACTIVITY: Your care partner should take you home directly after the procedure. You should plan to take it easy, moving slowly for the rest of the day. You can resume normal activity the day after the procedure however YOU SHOULD NOT DRIVE, use power tools, machinery or perform tasks that involve climbing or major physical exertion for 24 hours (because of the sedation medicines used during the test).   SYMPTOMS TO REPORT IMMEDIATELY: A gastroenterologist can be reached at any hour. Please call [phone]  for any of the following symptoms:   Following upper endoscopy (EGD, EUS, ERCP, esophageal dilation) Vomiting of blood or coffee ground material  New, significant abdominal pain  New, significant chest pain or pain under the shoulder blades  Painful or  persistently difficult swallowing  New shortness of breath  Black, tarry-looking or red, bloody stools  FOLLOW UP:  If any biopsies were taken you will be contacted by phone or by letter within the next 1-3 weeks. Call [phone]  if you have not heard about the biopsies in 3 weeks.  Please also call with any specific questions about appointments or follow up tests.  

## 2016-10-20 NOTE — Progress Notes (Signed)
Called to room to assist during endoscopic procedure.  Patient ID and intended procedure confirmed with present staff. Received instructions for my participation in the procedure from the performing physician.  

## 2016-10-20 NOTE — Op Note (Signed)
[facility] Patient Name: Wesley Mendez Procedure Date: 10/20/2016 10:01 AM MRN: [id number] Endoscopist: Beverley FiedlerJay M Sabino Denning , MD Age: 56 Referring MD:  Date of Birth: 12/09/1960 Gender: Male Account #: [id number] Procedure:                Upper GI endoscopy Indications:              Screening for Barrett's esophagus in patient at                            risk for this condition, Heartburn,                            Gastro-esophageal reflux disease Medicines:                Monitored Anesthesia Care Procedure:                Pre-Anesthesia Assessment:                           - Prior to the procedure, a History and Physical                            was performed, and patient medications and                            allergies were reviewed. The patient's tolerance of                            previous anesthesia was also reviewed. The risks                            and benefits of the procedure and the sedation                            options and risks were discussed with the patient.                            All questions were answered, and informed consent                            was obtained. Prior Anticoagulants: The patient has                            taken no previous anticoagulant or antiplatelet                            agents. ASA Grade Assessment: II - A patient with                            mild systemic disease. After reviewing the risks                            and benefits, the patient was deemed in  satisfactory condition to undergo the procedure.                           After obtaining informed consent, the endoscope was                            passed under direct vision. Throughout the                            procedure, the patient's blood pressure, pulse, and                            oxygen saturations were monitored continuously. The                            Endoscope was introduced through the mouth,  and                            advanced to the second part of duodenum. The upper                            GI endoscopy was accomplished without difficulty.                            The patient tolerated the procedure well. Scope In: Scope Out: Findings:                 The esophagus and gastroesophageal junction were                            examined with white light and narrow band imaging                            (NBI) from a forward view and retroflexed position.                            There were esophageal mucosal changes consistent                            with long-segment Barrett's esophagus. These                            changes involved the mucosa at the upper extent of                            the gastric folds (39 cm from the incisors)                            extending to the Z-line (33 cm from the incisors).                            Circumferential salmon-colored mucosa was present  from 36 to 39 cm, two tongues of salmon-colored                            mucosa were present from 33 to 36 cm and no visible                            abnormalities were present. The maximum                            longitudinal extent of these esophageal mucosal                            changes was 6 cm in length. Mucosa was biopsied                            with a cold forceps for histology in a targeted                            manner and in 4 quadrants at intervals of 2 cm from                            33 to 39 cm from the incisors. A total of 4                            specimen bottles were sent to pathology.                           A few small sessile polyps were found in the                            gastric fundus and in the gastric body. Polyp                            benign in appearance and consistent with fundic                            gland polyps.                           Normal mucosa was found in the entire  examined                            stomach.                           The examined duodenum was normal. Complications:            No immediate complications. Estimated Blood Loss:     Estimated blood loss was minimal. Impression:               - Esophageal mucosal changes consistent with                            long-segment Barrett's esophagus. Biopsied.                           -  A few, small, benign, gastric polyps.                           - Normal mucosa was found in the entire stomach.                           - Normal examined duodenum. Recommendation:           - Patient has a contact number available for                            emergencies. The signs and symptoms of potential                            delayed complications were discussed with the                            patient. Return to normal activities tomorrow.                            Written discharge instructions were provided to the                            patient.                           - Resume previous diet.                           - Continue present medications including omeprazole                            40 mg daily. GERD diet and reflux precautions.                           - Await pathology results.                           - Repeat upper endoscopy after studies are complete                            for surveillance based on pathology results. Beverley Fiedler, MD 10/20/2016 10:32:54 AM This report has been signed electronically.

## 2016-10-21 ENCOUNTER — Telehealth: Payer: Self-pay | Admitting: *Deleted

## 2016-10-21 NOTE — Telephone Encounter (Signed)
  Follow up Call-  Call back number 10/20/2016  Post procedure Call Back phone  # [phone]  Permission to leave phone message Yes  Some recent data might be hidden     Patient questions:  Do you have a fever, pain , or abdominal swelling? No. Pain Score  0 *  Have you tolerated food without any problems? Yes.    Have you been able to return to your normal activities? Yes.    Do you have any questions about your discharge instructions: Diet   No. Medications  No. Follow up visit  No.  Do you have questions or concerns about your Care? No.  Actions: * If pain score is 4 or above: No action needed, pain <4.

## 2016-10-26 ENCOUNTER — Encounter: Payer: Self-pay | Admitting: Internal Medicine

## 2016-11-06 ENCOUNTER — Other Ambulatory Visit (INDEPENDENT_AMBULATORY_CARE_PROVIDER_SITE_OTHER): Payer: Commercial Managed Care - HMO

## 2016-11-06 ENCOUNTER — Ambulatory Visit (INDEPENDENT_AMBULATORY_CARE_PROVIDER_SITE_OTHER): Payer: Commercial Managed Care - HMO | Admitting: Neurology

## 2016-11-06 ENCOUNTER — Encounter: Payer: Self-pay | Admitting: Neurology

## 2016-11-06 VITALS — BP 130/86 | HR 60 | Ht 74.0 in | Wt 240.6 lb

## 2016-11-06 DIAGNOSIS — G629 Polyneuropathy, unspecified: Secondary | ICD-10-CM | POA: Diagnosis not present

## 2016-11-06 DIAGNOSIS — G5620 Lesion of ulnar nerve, unspecified upper limb: Secondary | ICD-10-CM

## 2016-11-06 LAB — VITAMIN B12: Vitamin B-12: 353 pg/mL (ref 211–911)

## 2016-11-06 LAB — TSH: TSH: 1.8 u[IU]/mL (ref 0.35–4.50)

## 2016-11-06 LAB — FOLATE: Folate: 21.4 ng/mL (ref >5.9)

## 2016-11-06 NOTE — Progress Notes (Signed)
[facility] Clinic Note - Initial Visit   Date: 11/06/16  Wesley Mendez MRN: [id number] DOB: November 19, 1960   Dear Wesley Anis, PA:  Thank you for your kind referral of Wesley Mendez for consultation of paresthesias. Although his history is well known to you, please allow [place] to reiterate it for the purpose of our medical record. The patient was accompanied to the clinic by self.    History of Present Illness: Wesley Mendez is a 56 y.o. right-handed Caucasian male with gouty arthritis, spondylolisthesis s/p lumbar Mendez, and GERD presenting for evaluation of paresthesias.    In 2013, he had left foot drop, severe back pain, and numbness of the medial lower leg and foot.  Imaging showed spondylolisthesis L5-S1 with severe lumbar radiculopathy for which he underwent surgical decompression.  He did not have any symptoms of the right leg.  Following surgery, his foot drop improved, back pain resolved, and numbness improved such that he no longer has numbness medial lower leg, but had persistent numbness of the middle sole.    Starting around 2016, he started noticing new tingling > numbness of the soles bilaterally and has progressed to involve the tops of the toes.  Symptoms are constant and worse with prolonged sitting or standing. Rough surfaces tend to aggravate his feet, so he often wears shoes at home. He also has tingling of the hands especially with sitting > 30-minutes.  He feels that if his elbows are resting on surfaces such as when typing, his arms get numb within 10 minutes. Repositioning helps.    Out-side paper records, electronic medical record, and images have been reviewed where available and summarized as:  XR lumbar spine 09/04/2011:  Status post L5-S1 PLIF without radiographic evidence for complication.  Past Medical History:  Diagnosis Date  . Arthritis    lumbar congenital spondylolisthesis , radiculopathy, multiple areas of arthritis   . Chronic  kidney disease    as a young adult, spontaneous passing   . Colon polyps   . Complication of anesthesia    N&V- post nasal surgery-1980  . Elevated liver function tests   . GERD (gastroesophageal reflux disease)   . Gout   . H/O blood clots    shoulder  . Peripheral vascular disease (HCC)    post shoulder surgery- bld. clot in arm, treated with Coumadin   . PONV (postoperative nausea and vomiting)    no N&V post shoulder surgery- 2010    Past Surgical History:  Procedure Laterality Date  . LASIK Bilateral    2004  . LUMBAR Mendez  2013   L5  . ORIF NASAL FRACTURE     1980- fr. playing soccer  . SHOULDER ARTHROSCOPY W/ ACROMIAL REPAIR Right    dislocated shoulder,everything repaired except RC,       Medications:  Outpatient Encounter Prescriptions as of 11/06/2016  Medication Sig  . cetirizine (ZYRTEC) 10 MG tablet Take 10 mg by mouth daily.  . colchicine 0.6 MG tablet Take 0.6 mg by mouth as needed.  Marland Kitchen esomeprazole (NEXIUM) 40 MG capsule Take 40 mg by mouth daily at 12 noon.  . Febuxostat (ULORIC) 80 MG TABS Take 1 tablet by mouth daily.  . meloxicam (MOBIC) 15 MG tablet Take 15 mg by mouth daily.  Marland Kitchen tiZANidine (ZANAFLEX) 4 MG tablet Take 4 mg by mouth as needed for muscle spasms.  . [DISCONTINUED] omeprazole (PRILOSEC) 40 MG capsule Take 1 capsule (40 mg total) by mouth daily before supper.  Facility-Administered Encounter Medications as of 11/06/2016  Medication  . 0.9 %  sodium chloride infusion     Allergies:  Allergies  Allergen Reactions  . Seasonal Ic  [Cholestatin] Other (See Comments)    Sneezing and congestion    Family History: Family History  Problem Relation Age of Onset  . Colon polyps Mother   . Heart disease Maternal Grandmother   . Heart disease Paternal Grandmother   . Breast cancer Maternal Aunt   . Anesthesia problems Neg Hx   . Hypotension Neg Hx   . Malignant hyperthermia Neg Hx   . Pseudochol deficiency Neg Hx     Social  History: Social History  Substance Use Topics  . Smoking status: Never Smoker  . Smokeless tobacco: Never Used  . Alcohol use No   Social History   Social History Narrative   Lives with wife in a 3 story home.  Has no children.  Works doing Database administrator a/c.  Education: college.     Review of Systems:  CONSTITUTIONAL: No fevers, chills, night sweats, or weight loss.   EYES: No visual changes or eye pain ENT: No hearing changes.  No history of nose bleeds.   RESPIRATORY: No cough, wheezing and shortness of breath.   CARDIOVASCULAR: Negative for chest pain, and palpitations.   GI: Negative for abdominal discomfort, blood in stools or black stools.  No recent change in bowel habits.   GU:  No history of incontinence.   MUSCLOSKELETAL: +history of joint pain or swelling.  No myalgias.   SKIN: Negative for lesions, rash, and itching.   HEMATOLOGY/ONCOLOGY: Negative for prolonged bleeding, bruising easily, and swollen nodes.  No history of cancer.   ENDOCRINE: Negative for cold or heat intolerance, polydipsia or goiter.   PSYCH:  No depression or anxiety symptoms.   NEURO: As Above.   Vital Signs:  BP 130/86   Pulse 60   Ht  (1.88 m)   Wt 240 lb 9 oz (109.1 kg)   SpO2 98%   BMI 30.89 kg/m    General Medical Exam:   General:  Well appearing, comfortable.   Eyes/ENT: see cranial nerve examination.   Neck: No masses appreciated.  Full range of motion without tenderness.  No carotid bruits. Respiratory:  Clear to auscultation, good air entry bilaterally.   Cardiac:  Regular rate and rhythm, no murmur.   Extremities:  No deformities, edema, or skin discoloration.  Skin:  No rashes or lesions.  Neurological Exam: MENTAL STATUS including orientation to time, place, person, recent and remote memory, attention span and concentration, language, and fund of knowledge is normal.  Speech is not dysarthric.  CRANIAL NERVES: II:  No visual field defects.  Unremarkable fundi.    III-IV-VI: Pupils equal round and reactive to light.  Normal conjugate, extra-ocular eye movements in all directions of gaze.  No nystagmus.  No ptosis.   V:  Normal facial sensation.    VII:  Normal facial symmetry and movements.  VIII:  Normal hearing and vestibular function.   IX-X:  Normal palatal movement.   XI:  Normal shoulder shrug and head rotation.   XII:  Normal tongue strength and range of motion, no deviation or fasciculation.  MOTOR:  Mild left EDB atrophy, as compared to the right.  No fasciculations or abnormal movements.  No pronator drift.  Tone is normal.    Right Upper Extremity:    Left Upper Extremity:    Deltoid  5/5   Deltoid  5/5   Biceps  5/5   Biceps  5/5   Triceps  5/5   Triceps  5/5   Wrist extensors  5/5   Wrist extensors  5/5   Wrist flexors  5/5   Wrist flexors  5/5   Finger extensors  5/5   Finger extensors  5/5   Finger flexors  5/5   Finger flexors  5/5   Dorsal interossei  5/5   Dorsal interossei  5/5   Abductor pollicis  5/5   Abductor pollicis  5/5   Tone (Ashworth scale)  0  Tone (Ashworth scale)  0   Right Lower Extremity:    Left Lower Extremity:    Hip flexors  5/5   Hip flexors  5/5   Hip extensors  5/5   Hip extensors  5/5   Knee flexors  5/5   Knee flexors  5/5   Knee extensors  5/5   Knee extensors  5/5   Dorsiflexors  5/5   Dorsiflexors  5/5   Plantarflexors  5/5   Plantarflexors  5/5   Toe extensors  5/5   Toe extensors  5/5   Toe flexors  5-/5   Toe flexors  5-/5   Tone (Ashworth scale)  0  Tone (Ashworth scale)  0   MSRs:  Right                                                                 Left brachioradialis 2+  brachioradialis 2+  biceps 2+  biceps 2+  triceps 2+  triceps 2+  patellar 2+  patellar 2+  ankle jerk 0  ankle jerk 0  Hoffman no  Hoffman no  plantar response down  plantar response down   SENSORY:  Vibration, temperature, and pin prick is reduced over the feet and worse distally.  Romberg's sign absent.    COORDINATION/GAIT: Normal finger-to- nose-finger.  Intact rapid alternating movements bilaterally.  Gait narrow based and stable.  Tandem gait intact. He is able to toe walk; heel walking on the left is impaired as he is able to bring the foot into dorsiflexion, but upon walking, the position is not sustained.  Heel walking on the right intact.   IMPRESSION: Mr. Porcaro is a 56 year-old gentleman with prior lumbar decompression at L5-S1 referred for evaluation of bilateral feet paresthesias.  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 I had extensive discussion with the patient regarding the pathogenesis, etiology, management, and natural course of neuropathy. Neuropathy tends to be slowly progressive, especially if a treatable etiology is not identified.  I would like to test for treatable causes of neuropathy. I discussed that in the vast majority of cases, despite checking for reversible causes, we are unable to find the underlying etiology and management is symptomatic.  Unlikely to be due to colchicine as he does not take this medication frequently.  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  He endorses resting his elbow on surfaces a lot and sleeping with elbows in a flexed position.  He will have NCS/EMG of the right arm and left leg to better characterize the nature of his symptoms.    PLAN/RECOMMENDATIONS:  1.  NCS/EMG of  the RIGHT arm and LEFT leg 2.  Check TSH, folate, vitamin B12, SPEP with IFE, copper, 2hrGTT 3.  Strategies to minimize trauma to the ulnar nerve discussed 4.  His paresthesias are not painful enough to take any medication at this time.  He may try OTC lidocaine ointment, if needed  Return to clinic after testing   The duration of this appointment visit was 35 minutes of face-to-face time with the patient.  Greater than 50% of this time was spent in counseling, explanation of diagnosis,  planning of further management, and coordination of care.   Thank you for allowing me to participate in patient's care.  If I can answer any additional questions, I would be pleased to do so.    Sincerely,    Donika K. Allena Katz, DO

## 2016-11-06 NOTE — Patient Instructions (Signed)
1.  Check blood work 2.  NCS/EMG of the RIGHT arm and LEFT leg 3.  Try to avoid repeated compression of the nerve around the elbow and limit over flexing the elbow  We will call you with the results

## 2016-11-06 NOTE — Progress Notes (Signed)
Note routed

## 2016-11-10 LAB — PROTEIN ELECTROPHORESIS, SERUM
ALPHA-1-GLOBULIN: 0.2 g/dL (ref 0.2–0.3)
Albumin ELP: 4.4 g/dL (ref 3.8–4.8)
Alpha-2-Globulin: 0.5 g/dL (ref 0.5–0.9)
Beta 2: 0.4 g/dL (ref 0.2–0.5)
Beta Globulin: 0.5 g/dL (ref 0.4–0.6)
GAMMA GLOBULIN: 1.1 g/dL (ref 0.8–1.7)
Total Protein, Serum Electrophoresis: 7.2 g/dL (ref 6.1–8.1)

## 2016-11-10 LAB — COPPER, SERUM: Copper: 64 ug/dL — ABNORMAL LOW (ref 70–175)

## 2016-11-11 LAB — IMMUNOFIXATION ELECTROPHORESIS
IGG (IMMUNOGLOBIN G), SERUM: 1191 mg/dL (ref 694–1618)
IgA: 246 mg/dL (ref 81–463)
IgM, Serum: 173 mg/dL (ref 48–271)

## 2016-11-13 ENCOUNTER — Other Ambulatory Visit: Payer: Commercial Managed Care - HMO

## 2016-11-13 ENCOUNTER — Telehealth: Payer: Self-pay | Admitting: *Deleted

## 2016-11-13 ENCOUNTER — Other Ambulatory Visit: Payer: Self-pay | Admitting: *Deleted

## 2016-11-13 DIAGNOSIS — R899 Unspecified abnormal finding in specimens from other organs, systems and tissues: Secondary | ICD-10-CM

## 2016-11-13 NOTE — Telephone Encounter (Signed)
-----   Message from Glendale Chard, DO sent at 11/12/2016  1:01 PM EDT ----- Please inform patient that labs are all within normal limits, except her copper is very mildly reduced. Let's check ceruloplasmin and zinc level to follow-up on this as sometime low levels of copper can cause numbness/tingling.  Thanks.

## 2016-11-13 NOTE — Telephone Encounter (Signed)
Patient given results and will come by to have more labs done.

## 2016-11-16 LAB — CERULOPLASMIN: Ceruloplasmin: 17 mg/dL — ABNORMAL LOW (ref 18–36)

## 2016-11-17 LAB — ZINC: Zinc: 69 ug/dL (ref 60–130)

## 2016-11-18 ENCOUNTER — Telehealth: Payer: Self-pay | Admitting: *Deleted

## 2016-11-18 ENCOUNTER — Encounter: Payer: Self-pay | Admitting: *Deleted

## 2016-11-18 NOTE — Progress Notes (Signed)
Message sent via MyChart.

## 2016-11-18 NOTE — Telephone Encounter (Signed)
-----   Message from Glendale Chard, DO sent at 11/18/2016 10:57 AM EDT ----- Please let patient know her copper levels are slightly low and I recommend that she start copper  daily supplements.

## 2016-11-18 NOTE — Telephone Encounter (Signed)
Attempted to contact patient by phone but no answer and no voicemail.  Will send message via My Chart.

## 2016-11-24 ENCOUNTER — Other Ambulatory Visit: Payer: Self-pay | Admitting: *Deleted

## 2016-11-24 ENCOUNTER — Other Ambulatory Visit: Payer: Commercial Managed Care - HMO

## 2016-11-24 ENCOUNTER — Encounter: Payer: Commercial Managed Care - HMO | Admitting: Neurology

## 2016-11-24 DIAGNOSIS — G629 Polyneuropathy, unspecified: Secondary | ICD-10-CM

## 2016-11-24 DIAGNOSIS — Z029 Encounter for administrative examinations, unspecified: Secondary | ICD-10-CM

## 2016-11-24 LAB — GLUCOSE TOLERANCE, 2 HOURS
GLUCOSE, FASTING: 94 mg/dL (ref 70–99)
Glucose, 1 Hour GTT: 187 mg/dL
Glucose, 2 hour: 195 mg/dL

## 2016-11-25 ENCOUNTER — Encounter: Payer: Self-pay | Admitting: Neurology

## 2016-11-25 ENCOUNTER — Telehealth: Payer: Self-pay | Admitting: *Deleted

## 2016-11-25 NOTE — Telephone Encounter (Signed)
-----   Message from Glendale Chard, DO sent at 11/25/2016  8:11 AM EDT ----- Please call and inform pt that his glucose tolerance testing was abnormal and there is signs of impaired glucose tolerance, or prediabetes.  This can increase the risk of developing diabetes. He should start lifestyle modification with exercise and low carb/sugar diet and also discuss with his primary care provider about management options and to monitor this.  Please fax these results to his PCP as FYI.  Thanks.

## 2016-11-25 NOTE — Telephone Encounter (Signed)
Patient given results and instructions.  Will fax results to PCP.

## 2016-11-30 ENCOUNTER — Other Ambulatory Visit: Payer: Commercial Managed Care - HMO

## 2016-12-15 ENCOUNTER — Ambulatory Visit (INDEPENDENT_AMBULATORY_CARE_PROVIDER_SITE_OTHER): Payer: Commercial Managed Care - HMO | Admitting: Neurology

## 2016-12-15 DIAGNOSIS — G5620 Lesion of ulnar nerve, unspecified upper limb: Secondary | ICD-10-CM | POA: Diagnosis not present

## 2016-12-15 DIAGNOSIS — G5601 Carpal tunnel syndrome, right upper limb: Secondary | ICD-10-CM

## 2016-12-15 DIAGNOSIS — M5417 Radiculopathy, lumbosacral region: Secondary | ICD-10-CM

## 2016-12-15 NOTE — Procedures (Signed)
[facility]  [address], [address]  [place], [place] [address] Tel: [phone] Fax:  [phone] Test Date:  12/15/2016  Patient: Wesley HsuCarmen Bilyk DOB: 05/16/1961 Physician: Nita Sickleonika Shadoe Cryan, DO  Sex: Male Height: 6\' 2"  Ref Phys: Nita Sickleonika Emileo Semel, DO  ID#: [id number] Temp: 34.8C Technician:    Patient Complaints: This is a 56 year-old man with history of L5-S1 decompression referred for evaluation of right arm and left leg paresthesias.  NCV & EMG Findings: Extensive electrodiagnostic testing of the right upper extremity and additional studies of the left lower extremity shows:  1. Right median sensory response shows reduced amplitude (11.6 V) and normal latency. Right mixed palmar sensory responses show prolonged latency. Right ulnar sensory response is within normal limits. 2. Right ulnar motor response shows markedly slowed conduction velocity across the elbow (A Elbow-B Elbow, 34 m/s), with normal latency and amplitude. Right median motor responses within normal limits.  3. Left superficial peroneal sensory response is absent. Left sural and right superficial peroneal sensory responses are within normal limits. 4. Left peroneal motor response at the extensor digitorum brevis shows reduced amplitude, and is normal at the tibialis anterior. Left tibial motor responses within normal limits. 5. Left tibial H reflex study is within normal limits, when corrected for patient's height. 6. In the right upper extremity, there is no evidence of active or chronic motor axon loss changes affecting any of the tested muscles. Motor unit configuration and recruitment pattern is within normal limits. 7. In the left lower extremity, active on chronic motor axon loss changes are seen affecting the L5 myotome.  Impression: 1. Right median neuropathy at or distal to the wrist, consistent with the clinical diagnosis of carpal tunnel syndrome. Overall, these findings are mild in degree  electrically. 2. Right ulnar neuropathy with slowing across the elbow, purely demyelinating in type. 3. Subacute L5 radiculopathy affecting the left lower extremity.  Absent left superficial peroneal sensory response is likely due to a proximal dorsal root ganglion within the intraspinal canal.   ___________________________ Nita Sickleonika Hulet Ehrmann, DO    Nerve Conduction Studies Anti Sensory Summary Table   Site NR Peak (ms) Norm Peak (ms) P-T Amp (V) Norm P-T Amp  Right Median Anti Sensory (2nd Digit)  Wrist    3.5 <3.6 11.6 >15  Left Sup Peroneal Anti Sensory (Ant Lat Mall)  34.8C  12 cm NR  <4.6  >4  Right Sup Peroneal Anti Sensory (Ant Lat Mall)  34.8C  12 cm    3.4 <4.6 5.0 >4  Left Sural Anti Sensory (Lat Mall)  34.8C  Calf    4.0 <4.6 5.4 >4  Right Ulnar Anti Sensory (5th Digit)  34.8C  Wrist    2.5 <3.1 13.4 >10   Motor Summary Table   Site NR Onset (ms) Norm Onset (ms) O-P Amp (mV) Norm O-P Amp Site1 Site2 Delta-0 (ms) Dist (cm) Vel (m/s) Norm Vel (m/s)  Right Median Motor (Abd Poll Brev)  34.8C  Wrist    3.8 <4.0 8.9 >6 Elbow Wrist 5.7 32.0 56 >50  Elbow    9.5  8.9         Left Peroneal Motor (Ext Dig Brev)  34.8C  Ankle    5.9 <6.0 1.7 >2.5 B Fib Ankle 9.6 40.0 42 >40  B Fib    15.5  1.6  Poplt B Fib 1.6 9.0 56 >40  Poplt    17.1  1.5         Left Peroneal  TA Motor (Tib Ant)  34.8C  Fib Head    4.2 <4.5 4.5 >3 Poplit Fib Head 1.6 8.0 50 >40  Poplit    5.8  4.5         Left Tibial Motor (Abd Hall Brev)  34.8C  Ankle    4.4 <6.0 8.5 >4 Knee Ankle 10.5 43.0 41 >40  Knee    14.9  7.7         Right Ulnar Motor (Abd Dig Minimi)  34.8C  Wrist    2.8 <3.1 7.3 >7 B Elbow Wrist 3.9 26.0 67 >50  B Elbow    6.7  6.2  A Elbow B Elbow 2.9 10.0 34 >50  A Elbow    9.6  6.0          Comparison Summary Table   Site NR Peak (ms) Norm Peak (ms) P-T Amp (V) Site1 Site2 Delta-P (ms) Norm Delta (ms)  Right Median/Ulnar Palm Comparison (Wrist - 8cm)  34.8C  Median Palm    2.3  <2.2 15.6 Median Palm Ulnar Palm 0.8   Ulnar Palm    1.5 <2.2 6.3       H Reflex Studies   NR H-Lat (ms) Lat Norm (ms) L-R H-Lat (ms)  Left Tibial (Gastroc)  34.8C     37.14 <35    EMG   Side Muscle Ins Act Fibs Psw Fasc Number Recrt Dur Dur. Amp Amp. Poly Poly. Comment  Right 1stDorInt Nml Nml Nml Nml Nml Nml Nml Nml Nml Nml Nml Nml N/A  Right Abd Poll Brev Nml Nml Nml Nml Nml Nml Nml Nml Nml Nml Nml Nml N/A  Right Ext Indicis Nml Nml Nml Nml Nml Nml Nml Nml Nml Nml Nml Nml N/A  Right PronatorTeres Nml Nml Nml Nml Nml Nml Nml Nml Nml Nml Nml Nml N/A  Right Triceps Nml Nml Nml Nml Nml Nml Nml Nml Nml Nml Nml Nml N/A  Right Biceps Nml Nml Nml Nml Nml Nml Nml Nml Nml Nml Nml Nml N/A  Right Deltoid Nml Nml Nml Nml Nml Nml Nml Nml Nml Nml Nml Nml N/A  Right ABD Dig Min Nml Nml Nml Nml Nml Nml Nml Nml Nml Nml Nml Nml N/A  Right FlexCarpiUln Nml Nml Nml Nml Nml Nml Nml Nml Nml Nml Nml Nml N/A  Left AntTibialis Nml 1+ Nml Nml 1- Rapid Some 1+ Some 1+ Some 1+ N/A  Left Gastroc Nml Nml Nml Nml Nml Nml Nml Nml Nml Nml Nml Nml N/A  Left Flex Dig Long Nml Nml Nml Nml 2- Rapid Some 1+ Some 1+ Nml Nml N/A  Left RectFemoris Nml Nml Nml Nml Nml Nml Nml Nml Nml Nml Nml Nml N/A  Left GluteusMed Nml Nml Nml Nml Nml Nml Nml Nml Nml Nml Nml Nml N/A  Left Fibularis Long Nml Nml Nml Nml 1- Rapid Some 1+ Some 1+ Nml Nml N/A  Left BicepsFemS Nml Nml Nml Nml Nml Nml Nml Nml Nml Nml Nml Nml N/A      Waveforms:

## 2016-12-16 ENCOUNTER — Telehealth: Payer: Self-pay | Admitting: *Deleted

## 2016-12-16 NOTE — Telephone Encounter (Signed)
-----   Message from Donika K Patel, DO sent at 12/16/2016 10:00 AM EDT ----- Please inform patient that his nerve testing indicated that there may be pinched nerve in his back causing his left foot numbness/tingling.  MRI lumbar wo spine is recommended to evaluate this.  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  Recommend that he start using a wrist splint and elbow pad.  He may want a f/u visit to discuss  - okay to add to next Tuesday. 

## 2016-12-16 NOTE — Telephone Encounter (Signed)
-----   Message from Glendale Chardonika K Patel, DO sent at 12/16/2016 10:00 AM EDT ----- Please inform patient that his nerve testing indicated that there may be pinched nerve in his back causing his left foot numbness/tingling.  MRI lumbar wo spine is recommended to evaluate this.  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  Recommend that he start using a wrist splint and elbow pad.  He may want a f/u visit to discuss  - okay to add to next Tuesday.

## 2016-12-16 NOTE — Telephone Encounter (Signed)
Left message for patient to call me back. 

## 2016-12-17 ENCOUNTER — Telehealth: Payer: Self-pay | Admitting: *Deleted

## 2016-12-17 NOTE — Telephone Encounter (Signed)
I spoke with patient's wife and she said that they are going to follow up with his neurosurgeon instead of doing the MRI now.  Other instructions given.

## 2016-12-17 NOTE — Telephone Encounter (Signed)
Noted  

## 2016-12-17 NOTE — Telephone Encounter (Signed)
-----   Message from Glendale Chardonika K Patel, DO sent at 12/16/2016 10:00 AM EDT ----- Please inform patient that his nerve testing indicated that there may be pinched nerve in his back causing his left foot numbness/tingling.  MRI lumbar wo spine is recommended to evaluate this.  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  Recommend that he start using a wrist splint and elbow pad.  He may want a f/u visit to discuss  - okay to add to next Tuesday.

## 2019-04-10 LAB — HM COLONOSCOPY

## 2019-09-07 ENCOUNTER — Encounter: Payer: Self-pay | Admitting: Gastroenterology

## 2022-12-08 ENCOUNTER — Encounter: Payer: Self-pay | Admitting: Nurse Practitioner

## 2022-12-25 ENCOUNTER — Ambulatory Visit: Payer: Commercial Managed Care - HMO | Admitting: Nurse Practitioner

## 2023-01-01 ENCOUNTER — Ambulatory Visit (INDEPENDENT_AMBULATORY_CARE_PROVIDER_SITE_OTHER): Payer: 59 | Admitting: Gastroenterology

## 2023-01-01 ENCOUNTER — Encounter: Payer: Self-pay | Admitting: Gastroenterology

## 2023-01-01 VITALS — BP 136/88 | HR 53 | Ht 74.0 in | Wt 234.2 lb

## 2023-01-01 DIAGNOSIS — K2270 Barrett's esophagus without dysplasia: Secondary | ICD-10-CM

## 2023-01-01 DIAGNOSIS — K219 Gastro-esophageal reflux disease without esophagitis: Secondary | ICD-10-CM | POA: Diagnosis not present

## 2023-01-01 NOTE — Progress Notes (Addendum)
01/01/2023 ERICK AYE [id number] 1961-01-18   HISTORY OF PRESENT ILLNESS:  This is a 62 year old male who was previously a patient of Dr. Lauro Franklin.  He had an EGD here in 09/2016 at which time he was found to have Barrett's esophagus and a few benign gastric polyps.  1. Surgical [P], at 39cm - FINDINGS CONSISTENT WITH BARRETT'S ESOPHAGUS. - NO DYSPLASIA OR MALIGNANCY IDENTIFIED. - SEE COMMENT. 2. Surgical [P], at 37 cm - FINDINGS CONSISTENT WITH BARRETT'S ESOPHAGUS. - NO DYSPLASIA OR MALIGNANCY IDENTIFIED. - SEE COMMENT. 3. Surgical [P], at 35cm - FINDINGS CONSISTENT WITH BARRETT'S ESOPHAGUS. - NO DYSPLASIA OR MALIGNANCY IDENTIFIED. - SEE COMMENT. 4. Surgical [P], at 33cm - FINDINGS CONSISTENT WITH BARRETT'S ESOPHAGUS. - NO DYSPLASIA OR MALIGNANCY IDENTIFIED. - SEE COMMENT.  Subsequently he saw gastroenterology at another facility, Dr. Wynelle Beckmann, where he had a colonoscopy in September 2020 and an endoscopy in April 2021.  We are not able to see those results so we will have him sign for records.  Overall he says that his acid reflux is under good control.  He takes Nexium 40 mg twice daily.  He is here today to schedule a repeat endoscopy for surveillance.  Referred here on this occasion by his PCP, Dr. Lillie Columbia, for GERD.   Past Medical History:  Diagnosis Date   Arthritis    lumbar congenital spondylolisthesis , radiculopathy, multiple areas of arthritis    Barrett's esophagus    Chronic kidney disease    as a young adult, spontaneous passing    Colon polyps    Complication of anesthesia    N&V- post nasal surgery-1980   Elevated liver function tests    GERD (gastroesophageal reflux disease)    Gout    H/O blood clots    shoulder   Peripheral vascular disease (HCC)    post shoulder surgery- bld. clot in arm, treated with Coumadin    PONV (postoperative nausea and vomiting)    no N&V post shoulder surgery- 2010   Past Surgical History:  Procedure Laterality  Date   LASIK Bilateral    2004   LUMBAR FUSION  2013   L5   ORIF NASAL FRACTURE     1980- fr. playing soccer   SHOULDER ARTHROSCOPY W/ ACROMIAL REPAIR Right    dislocated shoulder,everything repaired except RC,      reports that he has never smoked. He has never used smokeless tobacco. He reports that he does not drink alcohol and does not use drugs. family history includes Arthritis in his sister; Breast cancer in his maternal aunt; Colon polyps in his mother; Dementia in his father; Heart disease in his father, maternal grandmother, and paternal grandmother. Allergies  Allergen Reactions   Seasonal Ic  [Cholestatin] Other (See Comments)    Sneezing and congestion   Warfarin Other (See Comments)   Allopurinol Photosensitivity      Outpatient Encounter Medications as of 01/01/2023  Medication Sig   cetirizine (ZYRTEC) 10 MG tablet Take 10 mg by mouth daily.   colchicine 0.6 MG tablet Take 0.6 mg by mouth as needed.   esomeprazole (NEXIUM) 40 MG capsule Take 40 mg by mouth in the morning and at bedtime.   Febuxostat (ULORIC) 80 MG TABS Take 1 tablet by mouth daily.   meloxicam (MOBIC) 15 MG tablet Take 15 mg by mouth daily.   tiZANidine (ZANAFLEX) 4 MG tablet Take 4 mg by mouth as needed for muscle spasms.   Facility-Administered Encounter Medications  as of 01/01/2023  Medication   0.9 %  sodium chloride infusion    REVIEW OF SYSTEMS  : All other systems reviewed and negative except where noted in the History of Present Illness.   PHYSICAL EXAM: BP 136/88   Pulse (!) 53   Ht 6\' 2"  (1.88 m)   Wt 234 lb 4 oz (106.3 kg)   SpO2 96%   BMI 30.08 kg/m  General: Well developed white male in no acute distress Head: Normocephalic and atraumatic Eyes:  Sclerae anicteric, conjunctiva pink. Ears: Normal auditory acuity Lungs: Clear throughout to auscultation; no W/R/R. Heart: Regular rate and rhythm; no M/R/G. Abdomen: Soft, non-distended.  BS present.  Non-tender. Musculoskeletal:  Symmetrical with no gross deformities  Skin: No lesions on visible extremities Extremities: No edema  Neurological: Alert oriented x 4, grossly non-focal Psychological:  Alert and cooperative. Normal mood and affect  ASSESSMENT AND PLAN: *GERD/Barrett's esophagus:  Had EGD in 09/2016.  Repeat at [facility] in 10/2019.  Due for repeat.  Will schedule with Dr. Rhea Belton.  Symptoms well controlled on Nexium 40 mg BID.  The risks, benefits, and alternatives to EGD were discussed with the patient and he consents to proceed.  **Will have him sign for EGD records from 10/2019 and colonoscopy records from 03/2019.  **Addendum records received from digestive health specialists.  EGD April 2021 showed 2 cm tongue of Barrett's esophagus in the lower third of the esophagus, hiatal hernia, normal stomach, normal duodenum.  Pathology showed columnar mucosa positive for Barrett's esophagus with mild and reactive changes, negative for dysplasia.  Colonoscopy from September 2020 showed a single 3 mm polyp that was removed from the sigmoid colon.  Pathology actually showed colonic mucosa with benign lymphoid aggregate, negative for adenomatous change.  Colonoscopy from August 2015 also showed a 3 mm polyp that was removed from the cecum on this occasion.  The pathology did show a tubular adenoma.  CC:  Stefanie Libel, MD

## 2023-01-01 NOTE — Patient Instructions (Signed)
You have been scheduled for an endoscopy. Please follow written instructions given to you at your visit today. If you use inhalers (even only as needed), please bring them with you on the day of your procedure.  _______________________________________________________  If your blood pressure at your visit was 140/90 or greater, please contact your primary care physician to follow up on this.  _______________________________________________________  If you are age 80 or older, your body mass index should be between 23-30. Your Body mass index is 30.08 kg/m. If this is out of the aforementioned range listed, please consider follow up with your Primary Care Provider.  If you are age 60 or younger, your body mass index should be between 19-25. Your Body mass index is 30.08 kg/m. If this is out of the aformentioned range listed, please consider follow up with your Primary Care Provider.   ________________________________________________________  The  GI providers would like to encourage you to use [facility] to communicate with providers for non-urgent requests or questions.  Due to long hold times on the telephone, sending your provider a message by [facility] may be a faster and more efficient way to get a response.  Please allow 48 business hours for a response.  Please remember that this is for non-urgent requests.  _______________________________________________________

## 2023-01-04 NOTE — Progress Notes (Signed)
Addendum: Reviewed and agree with assessment and management plan. Deloise Marchant M, MD  

## 2023-02-24 ENCOUNTER — Ambulatory Visit (AMBULATORY_SURGERY_CENTER): Payer: 59 | Admitting: Internal Medicine

## 2023-02-24 ENCOUNTER — Encounter: Payer: Self-pay | Admitting: Internal Medicine

## 2023-02-24 VITALS — BP 135/88 | HR 61 | Temp 98.2°F | Resp 12 | Ht 74.0 in | Wt 234.0 lb

## 2023-02-24 DIAGNOSIS — K2270 Barrett's esophagus without dysplasia: Secondary | ICD-10-CM

## 2023-02-24 DIAGNOSIS — K219 Gastro-esophageal reflux disease without esophagitis: Secondary | ICD-10-CM

## 2023-02-24 MED ORDER — SODIUM CHLORIDE 0.9 % IV SOLN: 500.0000 mL | Freq: Once | INTRAVENOUS | Status: DC

## 2023-02-24 NOTE — Op Note (Signed)
[facility] Patient Name: Wesley Mendez Procedure Date: 02/24/2023 3:15 PM MRN: [id number] Endoscopist: Beverley Fiedler , MD, [id number] Age: 62 Referring MD:  Date of Birth: [birth year] Gender: Male Account #: [id number] Procedure:                Upper GI endoscopy Indications:              Surveillance for malignancy due to personal history                            of Barrett's esophagus; last EGD 2021, index 2018 Medicines:                Monitored Anesthesia Care Procedure:                Pre-Anesthesia Assessment:                           - Prior to the procedure, a History and Physical                            was performed, and patient medications and                            allergies were reviewed. The patient's tolerance of                            previous anesthesia was also reviewed. The risks                            and benefits of the procedure and the sedation                            options and risks were discussed with the patient.                            All questions were answered, and informed consent                            was obtained. Prior Anticoagulants: The patient has                            taken no anticoagulant or antiplatelet agents. ASA                            Grade Assessment: II - A patient with mild systemic                            disease. After reviewing the risks and benefits,                            the patient was deemed in satisfactory condition to                            undergo the procedure.  After obtaining informed consent, the endoscope was                            passed under direct vision. Throughout the                            procedure, the patient's blood pressure, pulse, and                            oxygen saturations were monitored continuously. The                            Olympus scope [id number] was introduced through the                            mouth,  and advanced to the second part of duodenum.                            The upper GI endoscopy was accomplished without                            difficulty. The patient tolerated the procedure                            well. Scope In: Scope Out: Findings:                 The esophagus and gastroesophageal junction were                            examined with white light and narrow band imaging                            (NBI) from a forward view and retroflexed position.                            There were esophageal mucosal changes consistent                            with long-segment Barrett's esophagus. These                            changes involved the mucosa at the upper extent of                            the gastric folds (39 cm from the incisors)                            extending to the Z-line (34 cm from the incisors).                            Circumferential salmon-colored mucosa was present  from 38 to 39 cm, one tongue of salmon-colored                            mucosa was present from 34 to 38 cm and hiatal                            narrowing was identified at 39 cm. The maximum                            longitudinal extent of these esophageal mucosal                            changes was 5 cm in length. Mucosa was biopsied                            with a cold forceps for histology in a targeted                            manner and in 4 quadrants at intervals of 2 cm. A                            total of 3 specimen bottles were sent to pathology.                           A 4 cm hiatal hernia was present.                           The entire examined stomach was normal.                           The examined duodenum was normal. Complications:            No immediate complications. Estimated Blood Loss:     Estimated blood loss was minimal. Impression:               - Esophageal mucosal changes consistent with                             long-segment Barrett's esophagus. Biopsied.                           - 4 cm hiatal hernia.                           - Normal stomach.                           - Normal examined duodenum. Recommendation:           - Patient has a contact number available for                            emergencies. The signs and symptoms of potential  delayed complications were discussed with the                            patient. Return to normal activities tomorrow.                            Written discharge instructions were provided to the                            patient.                           - Resume previous diet.                           - Continue present medications.                           - Await pathology results.                           - Repeat upper endoscopy in 3 years for                            surveillance based on pathology results. Beverley Fiedler, MD 02/24/2023 3:31:43 PM This report has been signed electronically.

## 2023-02-24 NOTE — Patient Instructions (Signed)
Please read handouts provided. Continue present medications. Await pathology results. Resume previous diet. Repeat upper endoscopy in 3 years for screening based on pathology results.   YOU HAD AN ENDOSCOPIC PROCEDURE TODAY AT THE [facility]:   Refer to the procedure report that was given to you for any specific questions about what was found during the examination.  If the procedure report does not answer your questions, please call your gastroenterologist to clarify.  If you requested that your care partner not be given the details of your procedure findings, then the procedure report has been included in a sealed envelope for you to review at your convenience later.  YOU SHOULD EXPECT: Some feelings of bloating in the abdomen. Passage of more gas than usual.  Walking can help get rid of the air that was put into your GI tract during the procedure and reduce the bloating. If you had a lower endoscopy (such as a colonoscopy or flexible sigmoidoscopy) you may notice spotting of blood in your stool or on the toilet paper. If you underwent a bowel prep for your procedure, you may not have a normal bowel movement for a few days.  Please Note:  You might notice some irritation and congestion in your nose or some drainage.  This is from the oxygen used during your procedure.  There is no need for concern and it should clear up in a day or so.  SYMPTOMS TO REPORT IMMEDIATELY:  Following upper endoscopy (EGD)  Vomiting of blood or coffee ground material  New chest pain or pain under the shoulder blades  Painful or persistently difficult swallowing  New shortness of breath  Fever of 100F or higher  Black, tarry-looking stools  For urgent or emergent issues, a gastroenterologist can be reached at any hour by calling [phone]. Do not use MyChart messaging for urgent concerns.    DIET:  We do recommend a small meal at first, but then you may proceed to your regular diet.  Drink  plenty of fluids but you should avoid alcoholic beverages for 24 hours.  ACTIVITY:  You should plan to take it easy for the rest of today and you should NOT DRIVE or use heavy machinery until tomorrow (because of the sedation medicines used during the test).    FOLLOW UP: Our staff will call the number listed on your records the next business day following your procedure.  We will call around 7:15- 8:00 am to check on you and address any questions or concerns that you may have regarding the information given to you following your procedure. If we do not reach you, we will leave a message.     If any biopsies were taken you will be contacted by phone or by letter within the next 1-3 weeks.  Please call us at [phone] if you have not heard about the biopsies in 3 weeks.    SIGNATURES/CONFIDENTIALITY: You and/or your care partner have signed paperwork which will be entered into your electronic medical record.  These signatures attest to the fact that that the information above on your After Visit Summary has been reviewed and is understood.  Full responsibility of the confidentiality of this discharge information lies with you and/or your care-partner.

## 2023-02-24 NOTE — Progress Notes (Signed)
GASTROENTEROLOGY PROCEDURE H&P NOTE   Primary Care Physician: Stefanie Libel, MD    Reason for Procedure:  GERD with Barrett's esophagus  Plan:    EGD  Patient is appropriate for endoscopic procedure(s) in the ambulatory ([facility]) setting.  The nature of the procedure, as well as the risks, benefits, and alternatives were carefully and thoroughly reviewed with the patient. Ample time for discussion and questions allowed. The patient understood, was satisfied, and agreed to proceed.     HPI: Wesley Mendez is a 62 y.o. male who presents for EGD.  Medical history as below.   No recent chest pain or shortness of breath.  No abdominal pain today.  Past Medical History:  Diagnosis Date   Allergy    Arthritis    lumbar congenital spondylolisthesis , radiculopathy, multiple areas of arthritis    Barrett's esophagus    Chronic kidney disease    as a young adult, spontaneous passing    Colon polyps    Complication of anesthesia    N&V- post nasal surgery-1980   Elevated liver function tests    GERD (gastroesophageal reflux disease)    Gout    H/O blood clots    shoulder   Peripheral vascular disease (HCC)    post shoulder surgery- bld. clot in arm, treated with Coumadin    PONV (postoperative nausea and vomiting)    no N&V post shoulder surgery- 2010    Past Surgical History:  Procedure Laterality Date   LASIK Bilateral    2004   LUMBAR FUSION  2013   L5   ORIF NASAL FRACTURE     1980- fr. playing soccer   SHOULDER ARTHROSCOPY W/ ACROMIAL REPAIR Right    dislocated shoulder,everything repaired except RC,      Prior to Admission medications   Medication Sig Start Date End Date Taking? Authorizing Provider  cetirizine (ZYRTEC) 10 MG tablet Take 10 mg by mouth daily.   Yes [provider]  colchicine 0.6 MG tablet Take 0.6 mg by mouth as needed.   Yes [provider]  esomeprazole (NEXIUM) 40 MG capsule Take 40 mg by mouth in the morning and at bedtime.    Yes [provider]  Febuxostat (ULORIC) 80 MG TABS Take 1 tablet by mouth daily.   Yes [provider]  tiZANidine (ZANAFLEX) 4 MG tablet Take 4 mg by mouth as needed for muscle spasms.   Yes [provider]  meloxicam (MOBIC) 15 MG tablet Take 15 mg by mouth daily.    [provider]    Current Outpatient Medications  Medication Sig Dispense Refill   cetirizine (ZYRTEC) 10 MG tablet Take 10 mg by mouth daily.     colchicine 0.6 MG tablet Take 0.6 mg by mouth as needed.     esomeprazole (NEXIUM) 40 MG capsule Take 40 mg by mouth in the morning and at bedtime.     Febuxostat (ULORIC) 80 MG TABS Take 1 tablet by mouth daily.     tiZANidine (ZANAFLEX) 4 MG tablet Take 4 mg by mouth as needed for muscle spasms.     meloxicam (MOBIC) 15 MG tablet Take 15 mg by mouth daily.     Current Facility-Administered Medications  Medication Dose Route Frequency Provider Last Rate Last Admin   0.9 %  sodium chloride infusion  500 mL Intravenous Once Perseus Westall, Carie Caddy, MD        Allergies as of 02/24/2023 - Review Complete 02/24/2023  Allergen Reaction Noted  Seasonal ic  [cholestatin] Other (See Comments) 06/05/2012   Warfarin Other (See Comments) 01/01/2023   Allopurinol Photosensitivity 01/01/2023    Family History  Problem Relation Age of Onset   Colon polyps Mother    Dementia Father    Heart disease Father    Arthritis Sister    Breast cancer Maternal Aunt    Heart disease Maternal Grandmother    Heart disease Paternal Grandmother    Anesthesia problems Neg Hx    Hypotension Neg Hx    Malignant hyperthermia Neg Hx    Pseudochol deficiency Neg Hx    Colon cancer Neg Hx    Esophageal cancer Neg Hx    Rectal cancer Neg Hx    Stomach cancer Neg Hx     Social History   Socioeconomic History   Marital status: Married    Spouse name: Not on file   Number of children: 0   Years of education: Not on file   Highest education level: Not on file   Occupational History   Occupation: heating and air  Tobacco Use   Smoking status: Never   Smokeless tobacco: Never  Vaping Use   Vaping status: Never Used  Substance and Sexual Activity   Alcohol use: No   Drug use: No   Sexual activity: Yes  Other Topics Concern   Not on file  Social History Narrative   Lives with wife in a 3 story home.  Has no children.  Works doing Database administrator a/c.  Education: college.    Social Determinants of Health   Financial Resource Strain: Not on file  Food Insecurity: Not on file  Transportation Needs: Not on file  Physical Activity: Not on file  Stress: Not on file  Social Connections: Unknown (12/05/2021)   Received [facility]   Social Network    Social Network: Not on file  Intimate Partner Violence: Unknown (10/28/2021)   Received from [facility] [address]   HITS    Physically Hurt: Not on file    Insult or Talk Down To: Not on file    Threaten Physical Harm: Not on file    Scream or Curse: Not on file    Physical Exam: Vital signs in last 24 hours: @BP  (!) 152/81   Pulse (!) 56   Temp 98.2 F (36.8 C) (Skin)   Ht 6\' 2"  (1.88 m)   Wt 234 lb (106.1 kg)   SpO2 97%   BMI 30.04 kg/m  GEN: NAD EYE: Sclerae anicteric ENT: MMM CV: Non-tachycardic Pulm: CTA b/l GI: Soft, NT/ND NEURO:  Alert & Oriented x 3   Erick Blinks, MD [facility]  02/24/2023 3:11 PM

## 2023-02-24 NOTE — Progress Notes (Signed)
VS by MO    Pt's states no medical or surgical changes since previsit or office visit.

## 2023-02-24 NOTE — Progress Notes (Signed)
Called to room to assist during endoscopic procedure.  Patient ID and intended procedure confirmed with present staff. Received instructions for my participation in the procedure from the performing physician.  

## 2023-02-24 NOTE — Progress Notes (Signed)
Report to PACU, RN, vss, BBS= Clear.  

## 2023-02-26 ENCOUNTER — Telehealth: Payer: Self-pay

## 2023-02-26 NOTE — Telephone Encounter (Signed)
  Follow up Call-     02/24/2023    2:31 PM  Call back number  Post procedure Call Back phone  # [phone]  Permission to leave phone message Yes     Patient questions:  Do you have a fever, pain , or abdominal swelling? No. Pain Score  0 *  Have you tolerated food without any problems? Yes.    Have you been able to return to your normal activities? Yes.    Do you have any questions about your discharge instructions: Diet   No. Medications  No. Follow up visit  No.  Do you have questions or concerns about your Care? No.  Actions: * If pain score is 4 or above: No action needed, pain <4.

## 2023-03-04 ENCOUNTER — Encounter: Payer: Self-pay | Admitting: Internal Medicine
